# Patient Record
Sex: Male | Born: 1980 | Race: Black or African American | Hispanic: No | Smoking: Current every day smoker
Health system: Southern US, Community
[De-identification: ages and names within clinical notes are randomized; demographics above are authoritative.]

## PROBLEM LIST (undated history)

## (undated) ENCOUNTER — Emergency Department (HOSPITAL_COMMUNITY): Admission: EM | Payer: No Typology Code available for payment source | Source: Home / Self Care

## (undated) DIAGNOSIS — R519 Headache, unspecified: Secondary | ICD-10-CM

## (undated) DIAGNOSIS — M549 Dorsalgia, unspecified: Secondary | ICD-10-CM

## (undated) DIAGNOSIS — R51 Headache: Secondary | ICD-10-CM

---

## 2010-01-23 ENCOUNTER — Emergency Department (HOSPITAL_COMMUNITY): Admission: EM | Admit: 2010-01-23 | Discharge: 2010-01-23 | Payer: Self-pay | Admitting: Emergency Medicine

## 2010-12-27 ENCOUNTER — Emergency Department (HOSPITAL_COMMUNITY): Payer: Self-pay

## 2010-12-27 ENCOUNTER — Observation Stay (HOSPITAL_COMMUNITY)
Admission: EM | Admit: 2010-12-27 | Discharge: 2010-12-28 | Disposition: A | Discharge status: Home / Self Care | Payer: Self-pay | Attending: Emergency Medicine | Admitting: Emergency Medicine

## 2010-12-27 DIAGNOSIS — L02619 Cutaneous abscess of unspecified foot: Principal | ICD-10-CM | POA: Insufficient documentation

## 2010-12-27 DIAGNOSIS — L03119 Cellulitis of unspecified part of limb: Secondary | ICD-10-CM | POA: Insufficient documentation

## 2010-12-27 LAB — CBC
HCT: 43.7 % (ref 39.0–52.0)
MCV: 86.9 fL (ref 78.0–100.0)
Platelets: 327 10*3/uL (ref 150–400)
RBC: 5.03 MIL/uL (ref 4.22–5.81)
WBC: 13.4 10*3/uL — ABNORMAL HIGH (ref 4.0–10.5)

## 2010-12-27 LAB — POCT I-STAT, CHEM 8
Chloride: 105 mEq/L (ref 96–112)
HCT: 46 % (ref 39.0–52.0)
Hemoglobin: 15.6 g/dL (ref 13.0–17.0)
Potassium: 3.6 mEq/L (ref 3.5–5.1)

## 2010-12-27 LAB — DIFFERENTIAL
Basophils Absolute: 0 10*3/uL (ref 0.0–0.1)
Eosinophils Absolute: 0.1 10*3/uL (ref 0.0–0.7)
Lymphocytes Relative: 28 % (ref 12–46)
Lymphs Abs: 3.8 10*3/uL (ref 0.7–4.0)
Neutrophils Relative %: 57 % (ref 43–77)

## 2010-12-29 ENCOUNTER — Inpatient Hospital Stay (INDEPENDENT_AMBULATORY_CARE_PROVIDER_SITE_OTHER)
Admission: RE | Admit: 2010-12-29 | Discharge: 2010-12-29 | Disposition: A | Discharge status: Home / Self Care | Payer: Self-pay | Source: Ambulatory Visit | Attending: Family Medicine | Admitting: Family Medicine

## 2010-12-29 DIAGNOSIS — L03119 Cellulitis of unspecified part of limb: Secondary | ICD-10-CM

## 2012-11-01 ENCOUNTER — Emergency Department (HOSPITAL_COMMUNITY)
Admission: EM | Admit: 2012-11-01 | Discharge: 2012-11-01 | Disposition: A | Discharge status: Home / Self Care | Payer: Self-pay | Attending: Emergency Medicine | Admitting: Emergency Medicine

## 2012-11-01 ENCOUNTER — Encounter (HOSPITAL_COMMUNITY): Payer: Self-pay | Admitting: Cardiology

## 2012-11-01 DIAGNOSIS — IMO0001 Reserved for inherently not codable concepts without codable children: Secondary | ICD-10-CM | POA: Insufficient documentation

## 2012-11-01 DIAGNOSIS — M545 Low back pain, unspecified: Secondary | ICD-10-CM | POA: Insufficient documentation

## 2012-11-01 DIAGNOSIS — F17200 Nicotine dependence, unspecified, uncomplicated: Secondary | ICD-10-CM | POA: Insufficient documentation

## 2012-11-01 DIAGNOSIS — M5412 Radiculopathy, cervical region: Secondary | ICD-10-CM | POA: Insufficient documentation

## 2012-11-01 MED ORDER — CYCLOBENZAPRINE HCL 10 MG PO TABS: 10.0000 mg | ORAL_TABLET | Freq: Two times a day (BID) | ORAL | Status: DC | PRN

## 2012-11-01 MED ORDER — OXYCODONE-ACETAMINOPHEN 5-325 MG PO TABS
1.0000 | ORAL_TABLET | Freq: Once | ORAL | Status: AC
  Administered 2012-11-01: 1 via ORAL
  Filled 2012-11-01: qty 1

## 2012-11-01 MED ORDER — NAPROXEN 500 MG PO TABS: 500.0000 mg | ORAL_TABLET | Freq: Two times a day (BID) | ORAL | Status: DC
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
## 2012-11-02 NOTE — ED Provider Notes (Signed)
Medical screening examination/treatment/procedure(s) were performed by non-physician practitioner and as supervising physician I was immediately available for consultation/collaboration.  Doug Sou, MD 11/02/12 0010

## 2012-12-31 ENCOUNTER — Encounter (HOSPITAL_COMMUNITY): Payer: Self-pay

## 2012-12-31 ENCOUNTER — Emergency Department (HOSPITAL_COMMUNITY)
Admission: EM | Admit: 2012-12-31 | Discharge: 2012-12-31 | Disposition: A | Discharge status: Home / Self Care | Payer: Self-pay | Attending: Emergency Medicine | Admitting: Emergency Medicine

## 2012-12-31 DIAGNOSIS — K0889 Other specified disorders of teeth and supporting structures: Secondary | ICD-10-CM

## 2012-12-31 DIAGNOSIS — K089 Disorder of teeth and supporting structures, unspecified: Secondary | ICD-10-CM | POA: Insufficient documentation

## 2012-12-31 DIAGNOSIS — R6884 Jaw pain: Secondary | ICD-10-CM | POA: Insufficient documentation

## 2012-12-31 DIAGNOSIS — F17200 Nicotine dependence, unspecified, uncomplicated: Secondary | ICD-10-CM | POA: Insufficient documentation

## 2012-12-31 MED ORDER — BUPIVACAINE HCL (PF) 0.5 % IJ SOLN
10.0000 mL | Freq: Once | INTRAMUSCULAR | Status: AC
  Administered 2012-12-31: 10 mL
  Filled 2012-12-31: qty 10

## 2012-12-31 MED ORDER — HYDROCODONE-ACETAMINOPHEN 5-325 MG PO TABS: 2.0000 | ORAL_TABLET | ORAL | Status: DC | PRN

## 2012-12-31 MED ORDER — PENICILLIN V POTASSIUM 500 MG PO TABS: 500.0000 mg | ORAL_TABLET | Freq: Three times a day (TID) | ORAL | Status: DC

## 2012-12-31 NOTE — ED Notes (Signed)
MD at bedside. 

## 2012-12-31 NOTE — ED Provider Notes (Signed)
History     CSN: [id number]  Arrival date & time 12/31/12  4098   First MD Initiated Contact with Patient 12/31/12 [phone]      Chief Complaint  Patient presents with  . Dental Pain    (Consider location/radiation/quality/duration/timing/severity/associated sxs/prior treatment) HPI Comments: The patient is a 32 year old otherwise healthy male who presents with dental pain that started gradually one week ago. The dental pain is severe, constant and progressively worsening. The pain is aching and located in right lower jaw. The pain does not radiate. Eating makes the pain worse. Nothing makes the pain better. The patient has not tried anything for pain. No associated symptoms. Patient denies headache, neck pain/stiffness, fever, NVD, edema, sore throat, throat swelling, wheezing, SOB, chest pain, abdominal pain.     Patient is a 32 y.o. male presenting with tooth pain.  Dental Pain   No past medical history on file.  No past surgical history on file.  No family history on file.  History  Substance Use Topics  . Smoking status: Current Every Day Smoker  . Smokeless tobacco: Not on file  . Alcohol Use: No      Review of Systems  HENT: Positive for dental problem.   All other systems reviewed and are negative.    Allergies  Bee pollen  Home Medications   Current Outpatient Rx  Name  Route  Sig  Dispense  Refill  . ibuprofen (ADVIL,MOTRIN) 200 MG tablet   Oral   Take 800 mg by mouth every 6 (six) hours as needed for pain.           BP 128/74  Pulse 85  Temp(Src) 98.5 F (36.9 C) (Oral)  Resp 22  SpO2 98%  Physical Exam  Nursing note and vitals reviewed. Constitutional: He is oriented to person, place, and time. He appears well-developed and well-nourished. No distress.  HENT:  Head: Normocephalic and atraumatic.  Mouth/Throat: Oropharynx is clear and moist. No oropharyngeal exudate.  Good dentition. Right lower molar tenderness to percussion. No edema  noted.   Eyes: Conjunctivae are normal.  Neck: Normal range of motion.  Cardiovascular: Normal rate and regular rhythm.  Exam reveals no gallop and no friction rub.   No murmur heard. Pulmonary/Chest: Effort normal and breath sounds normal. He has no wheezes. He has no rales. He exhibits no tenderness.  Abdominal: Soft. There is no tenderness.  Musculoskeletal: Normal range of motion.  Neurological: He is alert and oriented to person, place, and time. Coordination normal.  Speech is goal-oriented. Moves limbs without ataxia.   Skin: Skin is warm and dry.  Psychiatric: He has a normal mood and affect. His behavior is normal.    ED Course  NERVE BLOCK Date/Time: 12/31/2012 6:55 AM Performed by: Emilia Beck Authorized by: Emilia Beck Consent: Verbal consent obtained. Risks and benefits: risks, benefits and alternatives were discussed Consent given by: patient Patient understanding: patient states understanding of the procedure being performed Patient consent: the patient's understanding of the procedure matches consent given Procedure consent: procedure consent matches procedure scheduled Relevant documents: relevant documents present and verified Site marked: the operative site was marked Patient identity confirmed: verbally with patient Time out: Immediately prior to procedure a "time out" was called to verify the correct patient, procedure, equipment, support staff and site/side marked as required. Indications: pain relief Body area: face/mouth Nerve: inferior alveolar Laterality: right Patient sedated: no Patient position: sitting Needle gauge: 25 G Location technique: anatomical landmarks Local anesthetic: bupivacaine 0.5%  without epinephrine Anesthetic total: 2 ml Outcome: pain improved Patient tolerance: Patient tolerated the procedure well with no immediate complications.   (including critical care time)  Periodontal Exam  Labs Reviewed - No data to  display No results found.   1. Pain, dental       MDM  6:52 AM Patient reports relief of pain after dental block. I will prescribe the patient vicodin and penicillin for dental pain with instructions to follow up with the dentist from the resource guide. Patient is afebrile with stable vitals.         Emilia Beck, [place] 12/31/12 [phone]

## 2012-12-31 NOTE — ED Notes (Signed)
Patient complains of mouth pain to right lower teeth, no releif at home with ibuprofen, oragel or tramadol, pt reports ongoing for 1 week.

## 2013-01-01 NOTE — ED Provider Notes (Signed)
Medical screening examination/treatment/procedure(s) were performed by non-physician practitioner and as supervising physician I was immediately available for consultation/collaboration.    Vida Roller, MD 01/01/13 [phone]

## 2013-03-11 ENCOUNTER — Encounter (HOSPITAL_COMMUNITY): Payer: Self-pay

## 2013-03-11 ENCOUNTER — Emergency Department (HOSPITAL_COMMUNITY)
Admission: EM | Admit: 2013-03-11 | Discharge: 2013-03-11 | Disposition: A | Discharge status: Home / Self Care | Payer: Self-pay | Attending: Emergency Medicine | Admitting: Emergency Medicine

## 2013-03-11 DIAGNOSIS — K089 Disorder of teeth and supporting structures, unspecified: Secondary | ICD-10-CM | POA: Insufficient documentation

## 2013-03-11 DIAGNOSIS — K0889 Other specified disorders of teeth and supporting structures: Secondary | ICD-10-CM

## 2013-03-11 DIAGNOSIS — F17200 Nicotine dependence, unspecified, uncomplicated: Secondary | ICD-10-CM | POA: Insufficient documentation

## 2013-03-11 MED ORDER — PENICILLIN V POTASSIUM 500 MG PO TABS: 500.0000 mg | ORAL_TABLET | Freq: Four times a day (QID) | ORAL | Status: AC

## 2013-03-11 MED ORDER — OXYCODONE-ACETAMINOPHEN 5-325 MG PO TABS
2.0000 | ORAL_TABLET | Freq: Once | ORAL | Status: AC
  Administered 2013-03-11: 2 via ORAL
  Filled 2013-03-11: qty 2

## 2013-03-11 MED ORDER — OXYCODONE-ACETAMINOPHEN 5-325 MG PO TABS: 1.0000 | ORAL_TABLET | Freq: Three times a day (TID) | ORAL | Status: DC | PRN

## 2013-03-11 NOTE — ED Provider Notes (Signed)
History  This chart was scribed for Glade Nurse, PA-C working with Enid Skeens, MD by Greggory Stallion, ED scribe. This patient was seen in room TR05C/TR05C and the patient's care was started at 10:08 PM.  CSN: [id number] Arrival date & time 03/11/13  2145   Chief Complaint  Patient presents with  . Dental Pain   The history is provided by the patient. No language interpreter was used.    HPI Comments: Stephen Ward is a 32 y.o. male who presents to the Emergency Department complaining of gradual onset, constant lower left dental pain that started 1 week ago. Also complaining of lower right dental pain that has been intermittent since his last visit to the ED in May for which he never followed up. He rates his pain 10/10. Pt states he has a few cavities.  Patient denies fever, night sweats, chills, difficulty swallowing or opening mouth, SOB, nuchal rigidity or decreased ROM of neck.  Patient does not have a dentist and requests a resource guide at discharge.     History reviewed. No pertinent past medical history. History reviewed. No pertinent past surgical history. History reviewed. No pertinent family history. History  Substance Use Topics  . Smoking status: Current Every Day Smoker  . Smokeless tobacco: Not on file  . Alcohol Use: No    Review of Systems  Constitutional: Negative for fever and chills.  HENT: Positive for dental problem. Negative for sore throat, trouble swallowing, neck pain, neck stiffness and voice change.        Bottom left and bottom right  Eyes: Negative for photophobia and visual disturbance.  Respiratory: Negative for shortness of breath.   Cardiovascular: Negative for chest pain.  Gastrointestinal: Negative for nausea and vomiting.  Musculoskeletal: Negative for back pain.  Skin: Negative for rash.  Neurological: Negative for weakness, light-headedness and numbness.  Psychiatric/Behavioral: The patient is not nervous/anxious.     Allergies   Bee pollen  Home Medications   Current Outpatient Rx  Name  Route  Sig  Dispense  Refill  . HYDROcodone-acetaminophen (NORCO/VICODIN) 5-325 MG per tablet   Oral   Take 2 tablets by mouth every 4 (four) hours as needed for pain.   6 tablet   0   . ibuprofen (ADVIL,MOTRIN) 200 MG tablet   Oral   Take 800 mg by mouth every 6 (six) hours as needed for pain.         Marland Kitchen penicillin v potassium (VEETID) 500 MG tablet   Oral   Take 1 tablet (500 mg total) by mouth 3 (three) times daily.   30 tablet   0    BP 114/70  Pulse 77  Temp(Src) 98.6 F (37 C) (Oral)  Resp 20  SpO2 97%  Physical Exam  Nursing note and vitals reviewed. Constitutional: He is oriented to person, place, and time. He appears well-developed and well-nourished.  HENT:  Head: Normocephalic and atraumatic. No trismus in the jaw.  Mouth/Throat: Abnormal dentition. Dental caries present. No dental abscesses. Posterior oropharyngeal edema and posterior oropharyngeal erythema present. No oropharyngeal exudate or tonsillar abscesses.    Oropharynx is clear. Teeth appear carious. Very tender at tooth #17 and #32 with erythema and edema of the underlying gingiva. Poor dentition overall. Neck is nontender and supple without adenopathy or JVD.  Neck: Normal range of motion. Neck supple.  Cardiovascular: Normal rate.   Pulmonary/Chest: Effort normal. No respiratory distress. He has no wheezes.  Abdominal: Soft. There is no tenderness.  Musculoskeletal: Normal range of motion.  Neurological: He is alert and oriented to person, place, and time. No cranial nerve deficit.  Skin: Skin is warm and dry.  Psychiatric: He has a normal mood and affect.    ED Course  Procedures (including critical care time)  DIAGNOSTIC STUDIES: Oxygen Saturation is 97% on RA, normal by my interpretation.    COORDINATION OF CARE: 11:17 PM-Discussed treatment plan which includes antibiotics, ibuprofen, and pain medication with pt at bedside  and pt agreed to plan. Advised pt to follow up with a dentist in the next week.   Labs Reviewed - No data to display No results found. 1. Pain, dental     MDM  Patient with dental pain.  No gross abscess.  Exam unconcerning for Ludwig's angina or spread of infection.  Will treat with penicillin and pain medicine.  Urged patient to follow-up with dentist.  Provided resource material. Discussed options for treatment with pt who understands and is in agreement with discharge plan.   I personally performed the services described in this documentation, which was scribed in my presence. The recorded information has been reviewed and is accurate.    Glade Nurse, PA-C 03/11/13 2334

## 2013-03-11 NOTE — ED Notes (Signed)
Pt c/o pain to his bottom teeth, increase pain on the Left lower due to cavities x1 week

## 2013-03-11 NOTE — Discharge Instructions (Signed)
1. You may take up to 800 mg of ibuprofen (this is typically 4 over the counter pills) up to three times a day for your pain. For breakthrough pain you may take your prescribed Percocet. Do not take other medicines containing acetaminophen as Percocet contains acetaminophen (read the labels!). Do not drink alcohol, drive, or operate heavy machinery while taking Percocet.   2. You have been prescribed an antibiotic. Please take it as directed and until it is all gone.   3. RESOURCE GUIDE  Dental Assistance  Drs. Moreen Fowler Civils:  [address], [place], [place], [address], [phone]  Short-notice availability  Tooth evaluation: $100  Emergency Treatment: $200 (including exam, xrays, tooth extraction, and post-op visit)  Patients with Medicaid: [facility] [phone] [address], [phone] [address], [phone]  If unable to pay, or uninsured, contact [facility] [phone] in [place], [phone] in [facility]) to become qualified for the adult dental clinic  Other Low-Cost Community Dental Services: - [facility]: [address] [place], [place], [place], [address], [phone], Ext. [phone], 2nd and 4th Thursday of the month at 6:30am.  10 clients each day by appointment, can sometimes see walk-in patients if someone does not show for an appointment. [facility]:  [address] [place], [place], [address], [phone] [facility]:  [address], [place], [place], [address], [phone] [facility] Department:  [phone] [facility] Health Department:  [phone] [facility] Health Department:  [phone]

## 2013-03-13 NOTE — ED Provider Notes (Signed)
Medical screening examination/treatment/procedure(s) were performed by non-physician practitioner and as supervising physician I was immediately available for consultation/collaboration.   Enid Skeens, MD 03/13/13 [phone]

## 2013-03-28 ENCOUNTER — Emergency Department (HOSPITAL_COMMUNITY)
Admission: EM | Admit: 2013-03-28 | Discharge: 2013-03-28 | Disposition: A | Discharge status: Home / Self Care | Payer: Self-pay | Attending: Emergency Medicine | Admitting: Emergency Medicine

## 2013-03-28 ENCOUNTER — Encounter (HOSPITAL_COMMUNITY): Payer: Self-pay | Admitting: Emergency Medicine

## 2013-03-28 DIAGNOSIS — K029 Dental caries, unspecified: Secondary | ICD-10-CM | POA: Insufficient documentation

## 2013-03-28 DIAGNOSIS — F17200 Nicotine dependence, unspecified, uncomplicated: Secondary | ICD-10-CM | POA: Insufficient documentation

## 2013-03-28 DIAGNOSIS — K089 Disorder of teeth and supporting structures, unspecified: Secondary | ICD-10-CM | POA: Insufficient documentation

## 2013-03-28 DIAGNOSIS — K0889 Other specified disorders of teeth and supporting structures: Secondary | ICD-10-CM

## 2013-03-28 MED ORDER — LIDOCAINE VISCOUS 2 % MT SOLN
15.0000 mL | Freq: Once | OROMUCOSAL | Status: AC
  Administered 2013-03-28: 15 mL via OROMUCOSAL
  Filled 2013-03-28: qty 15

## 2013-03-28 MED ORDER — LIDOCAINE VISCOUS 2 % MT SOLN: 15.0000 mL | OROMUCOSAL | Status: DC | PRN

## 2013-03-28 MED ORDER — OXYCODONE-ACETAMINOPHEN 5-325 MG PO TABS
1.0000 | ORAL_TABLET | Freq: Once | ORAL | Status: AC
  Administered 2013-03-28: 2 via ORAL
  Filled 2013-03-28: qty 2

## 2013-03-28 MED ORDER — OXYCODONE-ACETAMINOPHEN 5-325 MG PO TABS: 1.0000 | ORAL_TABLET | Freq: Four times a day (QID) | ORAL | Status: DC | PRN

## 2013-03-28 MED ORDER — OXYCODONE-ACETAMINOPHEN 5-325 MG PO TABS: 1.0000 | ORAL_TABLET | Freq: Once | ORAL | Status: DC

## 2013-03-28 NOTE — ED Notes (Signed)
Pt stated that pain has increased on both sides of mouth. Unable to eat due on pain

## 2013-03-28 NOTE — ED Provider Notes (Signed)
CSN: [id number]     Arrival date & time 03/28/13  1740 History    This chart was scribed for Francee Piccolo, PA working with Toy Baker, MD by Quintella Reichert, ED Scribe. This patient was seen in room WTR7/WTR7 and the patient's care was started at 8:23 PM..     Chief Complaint  Patient presents with  . Dental Pain    teeth pain on both sides on mouth    The history is provided by the patient. No language interpreter was used.    HPI Comments: Stephen Ward is a 32 y.o. male who presents to the Emergency Department complaining of gradual-onset, gradually-worsening bilateral dental pain.  Pt has had lower right-sided dental pain intermittently for 3 months and pain to the lower left side that has continued over the last 3 weeks since being seen in the ED at that time. Patient was advised to follow up with a dentist at that time but has not scheduled a followup appointment. Patient rates his pain 10/10. States the pain is sharp and constant without radiation. No alleviating factors. Patient states that eating aggravates his pain. Patient denies any fevers, chills, drooling, difficulty breathing.   History reviewed. No pertinent past medical history.  History reviewed. No pertinent past surgical history.  Family History  Problem Relation Age of Onset  . Seizures Mother    History  Substance Use Topics  . Smoking status: Current Every Day Smoker  . Smokeless tobacco: Not on file  . Alcohol Use: Yes     Review of Systems  Constitutional: Negative for fever and chills.  HENT: Positive for dental problem. Negative for ear pain and drooling.   Neurological: Negative for headaches.  All other systems reviewed and are negative.      Allergies  Bee venom  Home Medications   Current Outpatient Rx  Name  Route  Sig  Dispense  Refill  . ibuprofen (ADVIL,MOTRIN) 200 MG tablet   Oral   Take 200 mg by mouth every 6 (six) hours as needed for pain.         Marland Kitchen  oxyCODONE-acetaminophen (PERCOCET/ROXICET) 5-325 MG per tablet   Oral   Take 1 tablet by mouth every 8 (eight) hours as needed for pain.   6 tablet   0   . lidocaine (XYLOCAINE) 2 % solution   Oral   Take 15 mLs by mouth as needed for pain.   100 mL   0   . oxyCODONE-acetaminophen (PERCOCET/ROXICET) 5-325 MG per tablet   Oral   Take 1-2 tablets by mouth every 6 (six) hours as needed for pain.   20 tablet   0    BP 127/73  Pulse 80  Temp(Src) 98.4 F (36.9 C) (Oral)  Resp 18  SpO2 97%  Physical Exam  Nursing note and vitals reviewed. Constitutional: He is oriented to person, place, and time. He appears well-developed and well-nourished. No distress.  HENT:  Head: Normocephalic and atraumatic. No trismus in the jaw.  Mouth/Throat: Uvula is midline, oropharynx is clear and moist and mucous membranes are normal. Abnormal dentition. Dental caries present. No dental abscesses or edematous.    Eyes: Conjunctivae and EOM are normal.  Neck: Neck supple. No tracheal deviation present.  Cardiovascular: Normal rate.   Pulmonary/Chest: Effort normal. No respiratory distress.  Musculoskeletal: Normal range of motion.  Neurological: He is alert and oriented to person, place, and time.  Skin: Skin is warm and dry. He is not diaphoretic.  Psychiatric: He has a normal mood and affect. His behavior is normal.    ED Course  Procedures (including critical care time)   Labs Reviewed - No data to display  No results found.  1. Pain, dental     MDM  Patient with toothache.  No gross abscess.  Exam unconcerning for Ludwig's angina or spread of infection.  Patient still taking penicillin as prescribed at last visit she finished this course of antibiotic. Pain medication indicated.  Discussed the importance of follow up with pentose for further evaluation and management of dental pain. Patient agreeable to plan. Patient is stable at time of discharge.      I personally performed the  services described in this documentation, which was scribed in my presence. The recorded information has been reviewed and is accurate.     Jeannetta Ellis, PA-C 03/28/13 2023

## 2013-03-29 NOTE — ED Provider Notes (Signed)
Medical screening examination/treatment/procedure(s) were performed by non-physician practitioner and as supervising physician I was immediately available for consultation/collaboration.  Bengie Kaucher T Taquana Bartley, MD 03/29/13 1516 

## 2013-11-17 ENCOUNTER — Encounter (HOSPITAL_COMMUNITY): Payer: Self-pay | Admitting: Emergency Medicine

## 2013-11-17 ENCOUNTER — Emergency Department (INDEPENDENT_AMBULATORY_CARE_PROVIDER_SITE_OTHER)
Admission: EM | Admit: 2013-11-17 | Discharge: 2013-11-17 | Disposition: A | Discharge status: Home / Self Care | Payer: No Typology Code available for payment source | Source: Home / Self Care | Attending: Emergency Medicine | Admitting: Emergency Medicine

## 2013-11-17 DIAGNOSIS — K0401 Reversible pulpitis: Secondary | ICD-10-CM

## 2013-11-17 MED ORDER — HYDROCODONE-ACETAMINOPHEN 5-325 MG PO TABS
ORAL_TABLET | ORAL | Status: AC
  Filled 2013-11-17: qty 2

## 2013-11-17 MED ORDER — OXYCODONE-ACETAMINOPHEN 5-325 MG PO TABS: ORAL_TABLET | ORAL | Status: DC

## 2013-11-17 MED ORDER — HYDROCODONE-ACETAMINOPHEN 5-325 MG PO TABS
2.0000 | ORAL_TABLET | Freq: Once | ORAL | Status: AC
  Administered 2013-11-17: 2 via ORAL

## 2013-11-17 MED ORDER — CLINDAMYCIN HCL 300 MG PO CAPS: 300.0000 mg | ORAL_CAPSULE | Freq: Four times a day (QID) | ORAL | Status: DC

## 2013-11-17 MED ORDER — MELOXICAM 15 MG PO TABS: 15.0000 mg | ORAL_TABLET | Freq: Every day | ORAL | Status: DC

## 2013-11-17 NOTE — Discharge Instructions (Signed)
Look up the [employer] for free dental clinics. Http://www.ncdental.org/ncds/Schedule.asp ° °Get there early and be prepared to wait. [employer] and [employer] have dental hygienist schools that provide low cost routine dental care.  ° °Other resources: °[facility] °[address] °[place], [place] °[phone] ° °Patients with Medicaid: °[facility]                     [facility] °[address]                                [address] °Phone:  [phone]                                                  Phone:  [phone] ° °If unable to pay or uninsured, contact:  [facility] or [facility] to become qualified for the adult dental clinic. ° °No matter what dental problem you have, it will not get better unless you get good dental care.  If the tooth is not taken care of, your symptoms will come back in time and you will be visiting us again in the Urgent Care Center with a bad toothache.  So, see your dentist as soon as possible.  If you don't have a dentist, we can give you a list of dentists.  Sometimes the most cost effective treatment is removal of the tooth.  This can be done very inexpensively through one of the low cost [facility] Centers such as the facility on [address] in [place] ([phone]).  The downside to this is that you will have one less tooth and this can effect your ability to chew. ° °Some other things that can be done for a dental infection include the following: ° °· Rinse your mouth out with hot salt water (1/2 tsp of table salt and a pinch of baking soda in 8 oz of hot water).  You can do this every 2 or 3 hours. °· Avoid cold foods, beverages, and cold air.  This will make your symptoms worse. °· Sleep with your head elevated.  Sleeping flat will cause your gums and oral tissues to swell and make them hurt more.  You can sleep on several pillows.  Even  better is to sleep in a recliner with your head higher than your heart. °· For mild to moderate pain, you can take Tylenol, ibuprofen, or Aleve. °· External application of heat by a heating pad, hot water bottle, or hot wet towel can help with pain and speed healing.  You can do this every 2 to 3 hours. Do not fall asleep on a heating pad since this can cause a burn.  ° °Go to www.goodrx.com to look up your medications. This will give you a list of where you can find your prescriptions at the most affordable prices.  ° °RESOURCE GUIDE ° °Dental Problems ° °Look up http://www.ncdental.org/ncds/Schedule.asp for a schedule of the [employer]'s free dental clinics called [facility]. They have clinics all around [place]. Get there early and be prepared to wait.  ° °[facility] °[address]  [place], [place] [address] °[phone] ° °[facility] °  [address] °[place], [place] °[phone] ° °Patients with Medicaid: °[facility]                      Dental °[address]                                [address] °Phone:  [phone]                                                  Phone:  [phone] ° °If unable to pay or uninsured, contact:  [facility] or [facility] to become qualified for the adult dental clinic. ° °

## 2013-11-17 NOTE — ED Notes (Signed)
Pt  Reports  About  3  Days  Of  A  Severe  l  Lower  Toothache  Which  He  Reports  Has not been           releived  By  [facility] meds

## 2013-11-17 NOTE — ED Provider Notes (Signed)
  Chief Complaint   Chief Complaint  Patient presents with  . Dental Pain    History of Present Illness   Stephen Ward is a-year-old male who two-day history of pain in the left, lower second molar. He denies any injury to the tooth but has had a cavity in that tooth for a while. There is no swelling, no purulent drainage, no difficulty swallowing or breathing, he does have trouble opening his mouth completely and it hurts worse with mouth opening. He's had some headache but no fever or chills. No facial pain or neck pain, no chest pain or shortness of breath.  Review of Systems   Other than as noted above, the patient denies any of the following symptoms: Systemic:  No fever or chills. ENT:  No ear ache, sore throat, nasal congestion, facial pain, or swelling. Lymphatic:  No adenopathy. Lungs:  No coughing or shortness of breath.  PMFSH   Past medical history, family history, social history, meds, and allergies were reviewed.   Physical Examination     Vital signs:  BP 122/54  Pulse 77  Temp(Src) 98.6 F (37 C) (Oral)  Resp 18  SpO2 100% General:  Alert, oriented, in no distress. ENT:  TMs and canals normal.  Nasal mucosa normal. Mouth exam:  His left, lower second molar was almost completely decayed. History tender to touch. There is no swelling of the gingiva no collection of pus. He has no external swelling, he does have some pain with mouth opening is unable his mouth completely. There is no swelling of the tongue or the floor the mouth. The pharynx is clear and airway is widely patent. Neck:  No swelling or adenopathy. Lungs:  Breath sounds clear and equal bilaterally.  No wheezes, rales or rhonchi. Heart:  Regular rhythm.  No gallops or murmers. Skin:  Clear, warm and dry.   Course in Urgent Care Center   Dental gel was applied to the tooth and he was given Norco 5/325 2 by mouth for pain.  Assessment   The encounter diagnosis was Pulpitis.  Plan   1.  Meds:   The following meds were prescribed:   Discharge Medication List as of 11/17/2013  1:30 PM    START taking these medications   Details  clindamycin (CLEOCIN) 300 MG capsule Take 1 capsule (300 mg total) by mouth 4 (four) times daily., Starting 11/17/2013, Until Discontinued, Normal    meloxicam (MOBIC) 15 MG tablet Take 1 tablet (15 mg total) by mouth daily., Starting 11/17/2013, Until Discontinued, Normal    !! oxyCODONE-acetaminophen (PERCOCET) 5-325 MG per tablet 1 to 2 tablets every 6 hours as needed for pain., Print     !! - Potential duplicate medications found. Please discuss with provider.      2.  Patient Education/Counseling:  The patient was given appropriate handouts, self care instructions, and instructed in symptomatic relief. Suggested sleeping with head of bed elevated and hot salt water mouthwash.   3.  Follow up:  The patient was told to follow up if no better in 3 to 4 days, if becoming worse in any way, and given some red flag symptoms such as difficulty swallowing or breathing which would prompt immediate return.  Follow up with a dentist as soon as posssible.     Reuben Likesavid C Zacharia Sowles, MD 11/17/13 [phone]

## 2015-01-05 ENCOUNTER — Emergency Department (HOSPITAL_COMMUNITY)
Admission: EM | Admit: 2015-01-05 | Discharge: 2015-01-05 | Disposition: A | Discharge status: Home / Self Care | Payer: No Typology Code available for payment source | Attending: Emergency Medicine | Admitting: Emergency Medicine

## 2015-01-05 ENCOUNTER — Encounter (HOSPITAL_COMMUNITY): Payer: Self-pay | Admitting: Emergency Medicine

## 2015-01-05 DIAGNOSIS — Y929 Unspecified place or not applicable: Secondary | ICD-10-CM | POA: Insufficient documentation

## 2015-01-05 DIAGNOSIS — S39012A Strain of muscle, fascia and tendon of lower back, initial encounter: Secondary | ICD-10-CM | POA: Insufficient documentation

## 2015-01-05 DIAGNOSIS — X58XXXA Exposure to other specified factors, initial encounter: Secondary | ICD-10-CM | POA: Insufficient documentation

## 2015-01-05 DIAGNOSIS — Z792 Long term (current) use of antibiotics: Secondary | ICD-10-CM | POA: Insufficient documentation

## 2015-01-05 DIAGNOSIS — Z720 Tobacco use: Secondary | ICD-10-CM | POA: Insufficient documentation

## 2015-01-05 DIAGNOSIS — Y999 Unspecified external cause status: Secondary | ICD-10-CM | POA: Insufficient documentation

## 2015-01-05 DIAGNOSIS — Z79899 Other long term (current) drug therapy: Secondary | ICD-10-CM | POA: Insufficient documentation

## 2015-01-05 DIAGNOSIS — Y939 Activity, unspecified: Secondary | ICD-10-CM | POA: Insufficient documentation

## 2015-01-05 MED ORDER — KETOROLAC TROMETHAMINE 60 MG/2ML IM SOLN
60.0000 mg | Freq: Once | INTRAMUSCULAR | Status: AC
  Administered 2015-01-05: 60 mg via INTRAMUSCULAR
  Filled 2015-01-05: qty 2

## 2015-01-05 MED ORDER — CYCLOBENZAPRINE HCL 10 MG PO TABS: 10.0000 mg | ORAL_TABLET | Freq: Two times a day (BID) | ORAL | Status: DC | PRN

## 2015-01-05 MED ORDER — TRAMADOL HCL 50 MG PO TABS: 50.0000 mg | ORAL_TABLET | Freq: Four times a day (QID) | ORAL | Status: DC | PRN

## 2015-01-05 MED ORDER — NAPROXEN 500 MG PO TABS: 500.0000 mg | ORAL_TABLET | Freq: Two times a day (BID) | ORAL | Status: DC

## 2015-01-05 NOTE — ED Provider Notes (Signed)
CSN: [id number]     Arrival date & time 01/05/15  [id number] History   First MD Initiated Contact with Patient 01/05/15 [phone]     Chief Complaint  Patient presents with  . Back Pain   Patient is a 34 y.o. male presenting with back pain. The history is provided by the patient.  Back Pain Location:  Lumbar spine Quality:  Aching Pain severity:  Moderate Onset quality:  Gradual Duration:  1 week Timing:  Constant Progression:  Worsening Chronicity:  New Context: not falling, not MVA and not recent injury   Relieved by:  Nothing Worsened by:  Bending Associated symptoms: no abdominal pain, no dysuria, no fever, no leg pain, no numbness, no paresthesias, no tingling and no weakness     History reviewed. No pertinent past medical history. History reviewed. No pertinent past surgical history. Family History  Problem Relation Age of Onset  . Seizures Mother    History  Substance Use Topics  . Smoking status: Current Every Day Smoker  . Smokeless tobacco: Not on file  . Alcohol Use: Yes    Review of Systems  Constitutional: Negative for fever.  Gastrointestinal: Negative for abdominal pain.  Genitourinary: Negative for dysuria.  Musculoskeletal: Positive for back pain.  Neurological: Negative for tingling, weakness, numbness and paresthesias.  All other systems reviewed and are negative.     Allergies  Bee venom  Home Medications   Prior to Admission medications   Medication Sig Start Date End Date Taking? Authorizing Provider  clindamycin (CLEOCIN) 300 MG capsule Take 1 capsule (300 mg total) by mouth 4 (four) times daily. 11/17/13   Reuben Likesavid C Keller, MD  cyclobenzaprine (FLEXERIL) 10 MG tablet Take 1 tablet (10 mg total) by mouth 2 (two) times daily as needed for muscle spasms. 01/05/15   Linwood DibblesJon Leiana Rund, MD  lidocaine (XYLOCAINE) 2 % solution Take 15 mLs by mouth as needed for pain. 03/28/13   Jennifer Piepenbrink, PA-C  naproxen (NAPROSYN) 500 MG tablet Take 1 tablet (500 mg total) by  mouth 2 (two) times daily. 01/05/15   Linwood DibblesJon Tayvon Culley, MD  traMADol (ULTRAM) 50 MG tablet Take 1 tablet (50 mg total) by mouth every 6 (six) hours as needed. 01/05/15   Linwood DibblesJon Hubbard Seldon, MD   BP 123/77 mmHg  Pulse 85  Temp(Src) 97.4 F (36.3 C) (Oral)  Resp 16  Ht 5\' 7"  (1.702 m)  Wt 210 lb (95.255 kg)  BMI 32.88 kg/m2  SpO2 99% Physical Exam  Constitutional: He appears well-developed and well-nourished.  HENT:  Head: Normocephalic and atraumatic.  Right Ear: External ear normal.  Left Ear: External ear normal.  Nose: Nose normal.  Eyes: Conjunctivae and EOM are normal.  Neck: Neck supple. No tracheal deviation present.  Cardiovascular: Normal rate and regular rhythm.  Exam reveals no friction rub.   No murmur heard. Pulmonary/Chest: Effort normal. No stridor. No respiratory distress.  Abdominal: Soft. He exhibits no distension. There is no tenderness. There is no rebound.  Musculoskeletal: He exhibits no edema or tenderness.       Lumbar back: He exhibits decreased range of motion, pain and spasm. He exhibits no swelling and no edema.  Neurological: He is alert. He is not disoriented. No cranial nerve deficit or sensory deficit. He exhibits normal muscle tone. Coordination normal.  Reflex Scores:      Patellar reflexes are 2+ on the right side and 2+ on the left side.      Achilles reflexes are 2+ on the right side  and 2+ on the left side. Skin: Skin is warm and dry. No rash noted. He is not diaphoretic. No erythema.  Psychiatric: He has a normal mood and affect. His behavior is normal. Thought content normal.  Nursing note and vitals reviewed.   ED Course  Procedures (including critical care time)   MDM   Final diagnoses:  Lumbar strain, initial encounter    No sign of acute neurological or vascular emergency associated with pt's back pain. Doubt radicular etiology, sciatica.  Pain medications , muscle relaxant.  Follow-up with her primary care doctor urgent care the symptoms  persist, discussed home exercises and supportive treatment.   Safe for outpatient follow up.     Linwood DibblesJon Evangelyn Crouse, MD 01/05/15 [phone]

## 2015-01-05 NOTE — ED Notes (Signed)
Patient coming from home with c/o of lower back pain that has been ongoing x 1 week.  Denies injury.

## 2015-01-05 NOTE — Discharge Instructions (Signed)
Back Pain, Adult Low back pain is very common. About 1 in 5 people have back pain.The cause of low back pain is rarely dangerous. The pain often gets better over time.About half of people with a sudden onset of back pain feel better in just 2 weeks. About 8 in 10 people feel better by 6 weeks.  CAUSES Some common causes of back pain include:  Strain of the muscles or ligaments supporting the spine.  Wear and tear (degeneration) of the spinal discs.  Arthritis.  Direct injury to the back. DIAGNOSIS Most of the time, the direct cause of low back pain is not known.However, back pain can be treated effectively even when the exact cause of the pain is unknown.Answering your caregiver's questions about your overall health and symptoms is one of the most accurate ways to make sure the cause of your pain is not dangerous. If your caregiver needs more information, he or she may order lab work or imaging tests (X-rays or MRIs).However, even if imaging tests show changes in your back, this usually does not require surgery. HOME CARE INSTRUCTIONS For many people, back pain returns.Since low back pain is rarely dangerous, it is often a condition that people can learn to manageon their own.   Remain active. It is stressful on the back to sit or stand in one place. Do not sit, drive, or stand in one place for more than 30 minutes at a time. Take short walks on level surfaces as soon as pain allows.Try to increase the length of time you walk each day.  Do not stay in bed.Resting more than 1 or 2 days can delay your recovery.  Do not avoid exercise or work.Your body is made to move.It is not dangerous to be active, even though your back may hurt.Your back will likely heal faster if you return to being active before your pain is gone.  Pay attention to your body when you bend and lift. Many people have less discomfortwhen lifting if they bend their knees, keep the load close to their bodies,and  avoid twisting. Often, the most comfortable positions are those that put less stress on your recovering back.  Find a comfortable position to sleep. Use a firm mattress and lie on your side with your knees slightly bent. If you lie on your back, put a pillow under your knees.  Only take over-the-counter or prescription medicines as directed by your caregiver. Over-the-counter medicines to reduce pain and inflammation are often the most helpful.Your caregiver may prescribe muscle relaxant drugs.These medicines help dull your pain so you can more quickly return to your normal activities and healthy exercise.  Put ice on the injured area.  Put ice in a plastic bag.  Place a towel between your skin and the bag.  Leave the ice on for 15-20 minutes, 03-04 times a day for the first 2 to 3 days. After that, ice and heat may be alternated to reduce pain and spasms.  Ask your caregiver about trying back exercises and gentle massage. This may be of some benefit.  Avoid feeling anxious or stressed.Stress increases muscle tension and can worsen back pain.It is important to recognize when you are anxious or stressed and learn ways to manage it.Exercise is a great option. SEEK MEDICAL CARE IF:  You have pain that is not relieved with rest or medicine.  You have pain that does not improve in 1 week.  You have new symptoms.  You are generally not feeling well. SEEK   IMMEDIATE MEDICAL CARE IF:   You have pain that radiates from your back into your legs.  You develop new bowel or bladder control problems.  You have unusual weakness or numbness in your arms or legs.  You develop nausea or vomiting.  You develop abdominal pain.  You feel faint. Document Released: 08/07/2005 Document Revised: 02/06/2012 Document Reviewed: 12/09/2013 [employer] Patient Information 2015 [employer]. This information is not intended to replace advice given to you by your health care provider. Make sure you  discuss any questions you have with your health care provider.  

## 2015-02-11 ENCOUNTER — Emergency Department (HOSPITAL_COMMUNITY)
Admission: EM | Admit: 2015-02-11 | Discharge: 2015-02-11 | Disposition: A | Discharge status: Home / Self Care | Payer: No Typology Code available for payment source | Attending: Emergency Medicine | Admitting: Emergency Medicine

## 2015-02-11 ENCOUNTER — Encounter (HOSPITAL_COMMUNITY): Payer: Self-pay | Admitting: *Deleted

## 2015-02-11 DIAGNOSIS — M545 Low back pain, unspecified: Secondary | ICD-10-CM

## 2015-02-11 DIAGNOSIS — Z720 Tobacco use: Secondary | ICD-10-CM | POA: Insufficient documentation

## 2015-02-11 MED ORDER — KETOROLAC TROMETHAMINE 60 MG/2ML IM SOLN
60.0000 mg | Freq: Once | INTRAMUSCULAR | Status: AC
  Administered 2015-02-11: 60 mg via INTRAMUSCULAR
  Filled 2015-02-11: qty 2

## 2015-02-11 MED ORDER — HYDROCODONE-ACETAMINOPHEN 5-325 MG PO TABS: 2.0000 | ORAL_TABLET | ORAL | Status: DC | PRN

## 2015-02-11 MED ORDER — METHOCARBAMOL 500 MG PO TABS: 500.0000 mg | ORAL_TABLET | Freq: Two times a day (BID) | ORAL | Status: DC

## 2015-02-11 MED ORDER — NAPROXEN 500 MG PO TABS: 500.0000 mg | ORAL_TABLET | Freq: Two times a day (BID) | ORAL | Status: DC

## 2015-02-11 MED ORDER — NAPROXEN 500 MG PO TABS
500.0000 mg | ORAL_TABLET | Freq: Two times a day (BID) | ORAL | Status: DC
Start: 2015-02-11 — End: 2015-02-11

## 2015-02-11 MED ORDER — DIAZEPAM 5 MG PO TABS
5.0000 mg | ORAL_TABLET | Freq: Once | ORAL | Status: AC
  Administered 2015-02-11: 5 mg via ORAL
  Filled 2015-02-11: qty 1

## 2015-02-11 NOTE — ED Notes (Signed)
NAD at this time. Pt is stable and going home.  

## 2015-02-11 NOTE — ED Provider Notes (Signed)
CSN: [id number]     Arrival date & time 02/11/15  [id number] History   First MD Initiated Contact with Patient 02/11/15 0920     Chief Complaint  Patient presents with  . Tailbone Pain     (Consider location/radiation/quality/duration/timing/severity/associated sxs/prior Treatment) HPI Stephen Ward is a 34 y.o. male who comes in for evaluation of low back pain. Patient states she was seen in the ED approximately one month ago for the same problem. He reports injuring his lower back at work picking up a box of foam and turning funny. He reports sharp pains that shoot from his tailbone of his back. This discomfort is intermittent and exacerbated with certain movements. He denies any fevers, chills, numbness or weakness, loss of bowel or bladder function, chronic steroid use, IV drug use. He reports receiving a muscle relaxer and another pain medicine that helped for a few days, but the discomfort has returned. Pain is rated as severe. No other modifying factors  History reviewed. No pertinent past medical history. History reviewed. No pertinent past surgical history. Family History  Problem Relation Age of Onset  . Seizures Mother    History  Substance Use Topics  . Smoking status: Current Every Day Smoker  . Smokeless tobacco: Not on file  . Alcohol Use: Yes    Review of Systems A 10 point review of systems was completed and was negative except for pertinent positives and negatives as mentioned in the history of present illness     Allergies  Bee venom  Home Medications   Prior to Admission medications   Medication Sig Start Date End Date Taking? Authorizing Provider  traMADol (ULTRAM) 50 MG tablet Take 1 tablet (50 mg total) by mouth every 6 (six) hours as needed. 01/05/15  Yes Linwood Dibbles, MD  HYDROcodone-acetaminophen (NORCO) 5-325 MG per tablet Take 2 tablets by mouth every 4 (four) hours as needed. 02/11/15   Joycie Peek, PA-C  methocarbamol (ROBAXIN) 500 MG tablet Take 1  tablet (500 mg total) by mouth 2 (two) times daily. 02/11/15   Joycie Peek, PA-C  naproxen (NAPROSYN) 500 MG tablet Take 1 tablet (500 mg total) by mouth 2 (two) times daily. 02/11/15   Caedan Sumler, PA-C   BP 107/70 mmHg  Pulse 72  Temp(Src) 98.6 F (37 C)  Resp 16  Ht  (1.702 m)  Wt 210 lb (95.255 kg)  BMI 32.88 kg/m2  SpO2 96% Physical Exam  Constitutional: He is oriented to person, place, and time. He appears well-developed and well-nourished.  HENT:  Head: Normocephalic and atraumatic.  Mouth/Throat: Oropharynx is clear and moist.  Eyes: Conjunctivae are normal. Pupils are equal, round, and reactive to light. Right eye exhibits no discharge. Left eye exhibits no discharge. No scleral icterus.  Neck: Neck supple.  Cardiovascular: Normal rate, regular rhythm and normal heart sounds.   Pulmonary/Chest: Effort normal and breath sounds normal. No respiratory distress. He has no wheezes. He has no rales.  Abdominal: Soft. There is no tenderness.  Musculoskeletal: He exhibits no tenderness.  Tenderness diffusely throughout right paraspinal lumbar muscles. No overt midline bony tenderness. No obvious lesions or rashes noted. Range of motion is decreased secondary to pain. Maintains full range motion of lower extremities.  Neurological: He is alert and oriented to person, place, and time.  Cranial Nerves II-XII grossly intact. Moves all extremities without ataxia. Patellar reflexes 2+ and equal bilaterally.  Skin: Skin is warm and dry. No rash noted.  Psychiatric: He has a normal  mood and affect.  Nursing note and vitals reviewed.   ED Course  Procedures (including critical care time) Labs Review Labs Reviewed - No data to display  Imaging Review No results found.   EKG Interpretation None     Meds given in ED:  Medications  ketorolac (TORADOL) injection 60 mg (60 mg Intramuscular Given 02/11/15 0955)  diazepam (VALIUM) tablet 5 mg (5 mg Oral Given 02/11/15 0956)     New Prescriptions   HYDROCODONE-ACETAMINOPHEN (NORCO) 5-325 MG PER TABLET    Take 2 tablets by mouth every 4 (four) hours as needed.   METHOCARBAMOL (ROBAXIN) 500 MG TABLET    Take 1 tablet (500 mg total) by mouth 2 (two) times daily.   NAPROXEN (NAPROSYN) 500 MG TABLET    Take 1 tablet (500 mg total) by mouth 2 (two) times daily.   Filed Vitals:   02/11/15 0935  BP: 107/70  Pulse: 72  Temp: 98.6 F (37 C)  Resp: 16  Height: 5\' 7"  (1.702 m)  Weight: 210 lb (95.255 kg)  SpO2: 96%     MDM  Vitals stable - WNL -afebrile Pt resting comfortably in ED. Sleeping upon reevaluation, however when he wakes up reports he still in pain. PE--normal neuro exam. Physical exam is consistent with lumbosacral strain.  DDX--encourage continued use of anti-inflammatory to home. Will DC with short course muscle relaxers and pain medicines. Also encouraged use of heat therapy and stretching to help with symptoms. No pathologic back pain red flags. No evidence of acute spinal cord pathology.  I discussed all relevant lab findings and imaging results with pt and they verbalized understanding. Discussed f/u with PCP within 48 hrs and return precautions, pt very amenable to plan.  Final diagnoses:  Right-sided low back pain without sciatica       Joycie Peek, PA-C 02/11/15 1028  Mancel Bale, MD 02/15/15 [phone]

## 2015-02-11 NOTE — ED Notes (Signed)
Pt stated that about a month ago, his tailbone started hurting very badly.  He stated that it is a sharp shooting pain that radiates from his tailbone to his neck.  He came to the ER to have it looked at, at that time.  He was given muscle relaxers which hasn't helped any.

## 2015-02-11 NOTE — Discharge Instructions (Signed)
You were evaluated in the ED today for your back pain. There does not appear to be an emergent cause her symptoms at this time. Please take your medications as prescribed. Do not take your pain medicines or muscle relaxers before driving or operating machinery. Follow up with primary care for further evaluation and management of your symptoms. Return to ED for new or worsening symptoms.  Back Pain, Adult Low back pain is very common. About 1 in 5 people have back pain.The cause of low back pain is rarely dangerous. The pain often gets better over time.About half of people with a sudden onset of back pain feel better in just 2 weeks. About 8 in 10 people feel better by 6 weeks.  CAUSES Some common causes of back pain include:  Strain of the muscles or ligaments supporting the spine.  Wear and tear (degeneration) of the spinal discs.  Arthritis.  Direct injury to the back. DIAGNOSIS Most of the time, the direct cause of low back pain is not known.However, back pain can be treated effectively even when the exact cause of the pain is unknown.Answering your caregiver's questions about your overall health and symptoms is one of the most accurate ways to make sure the cause of your pain is not dangerous. If your caregiver needs more information, he or she may order lab work or imaging tests (X-rays or MRIs).However, even if imaging tests show changes in your back, this usually does not require surgery. HOME CARE INSTRUCTIONS For many people, back pain returns.Since low back pain is rarely dangerous, it is often a condition that people can learn to [facility] their own.   Remain active. It is stressful on the back to sit or stand in one place. Do not sit, drive, or stand in one place for more than 30 minutes at a time. Take short walks on level surfaces as soon as pain allows.Try to increase the length of time you walk each day.  Do not stay in bed.Resting more than 1 or 2 days can delay your  recovery.  Do not avoid exercise or work.Your body is made to move.It is not dangerous to be active, even though your back may hurt.Your back will likely heal faster if you return to being active before your pain is gone.  Pay attention to your body when you bend and lift. Many people have less discomfortwhen lifting if they bend their knees, keep the load close to their bodies,and avoid twisting. Often, the most comfortable positions are those that put less stress on your recovering back.  Find a comfortable position to sleep. Use a firm mattress and lie on your side with your knees slightly bent. If you lie on your back, put a pillow under your knees.  Only take over-the-counter or prescription medicines as directed by your caregiver. Over-the-counter medicines to reduce pain and inflammation are often the most helpful.Your caregiver may prescribe muscle relaxant drugs.These medicines help dull your pain so you can more quickly return to your normal activities and healthy exercise.  Put ice on the injured area.  Put ice in a plastic bag.  Place a towel between your skin and the bag.  Leave the ice on for 15-20 minutes, 03-04 times a day for the first 2 to 3 days. After that, ice and heat may be alternated to reduce pain and spasms.  Ask your caregiver about trying back exercises and gentle massage. This may be of some benefit.  Avoid feeling anxious or stressed.Stress increases  muscle tension and can worsen back pain.It is important to recognize when you are anxious or stressed and learn ways to manage it.Exercise is a great option. SEEK MEDICAL CARE IF:  You have pain that is not relieved with rest or medicine.  You have pain that does not improve in 1 week.  You have new symptoms.  You are generally not feeling well. SEEK IMMEDIATE MEDICAL CARE IF:   You have pain that radiates from your back into your legs.  You develop new bowel or bladder control problems.  You  have unusual weakness or numbness in your arms or legs.  You develop nausea or vomiting.  You develop abdominal pain.  You feel faint. Document Released: 08/07/2005 Document Revised: 02/06/2012 Document Reviewed: 12/09/2013 [facility] Patient Information 2015 [place], [place]. This information is not intended to replace advice given to you by your health care provider. Make sure you discuss any questions you have with your health care provider.

## 2015-07-23 ENCOUNTER — Emergency Department (HOSPITAL_COMMUNITY)
Admission: EM | Admit: 2015-07-23 | Discharge: 2015-07-23 | Disposition: A | Discharge status: Home / Self Care | Payer: No Typology Code available for payment source | Attending: Emergency Medicine | Admitting: Emergency Medicine

## 2015-07-23 ENCOUNTER — Encounter (HOSPITAL_COMMUNITY): Payer: Self-pay | Admitting: *Deleted

## 2015-07-23 ENCOUNTER — Emergency Department (HOSPITAL_COMMUNITY): Payer: No Typology Code available for payment source

## 2015-07-23 DIAGNOSIS — Z79899 Other long term (current) drug therapy: Secondary | ICD-10-CM | POA: Insufficient documentation

## 2015-07-23 DIAGNOSIS — W260XXA Contact with knife, initial encounter: Secondary | ICD-10-CM | POA: Insufficient documentation

## 2015-07-23 DIAGNOSIS — S61211A Laceration without foreign body of left index finger without damage to nail, initial encounter: Secondary | ICD-10-CM | POA: Insufficient documentation

## 2015-07-23 DIAGNOSIS — Y9289 Other specified places as the place of occurrence of the external cause: Secondary | ICD-10-CM | POA: Insufficient documentation

## 2015-07-23 DIAGNOSIS — Z23 Encounter for immunization: Secondary | ICD-10-CM | POA: Insufficient documentation

## 2015-07-23 DIAGNOSIS — F17200 Nicotine dependence, unspecified, uncomplicated: Secondary | ICD-10-CM | POA: Insufficient documentation

## 2015-07-23 DIAGNOSIS — Y999 Unspecified external cause status: Secondary | ICD-10-CM | POA: Insufficient documentation

## 2015-07-23 DIAGNOSIS — Z791 Long term (current) use of non-steroidal anti-inflammatories (NSAID): Secondary | ICD-10-CM | POA: Insufficient documentation

## 2015-07-23 DIAGNOSIS — Y9389 Activity, other specified: Secondary | ICD-10-CM | POA: Insufficient documentation

## 2015-07-23 MED ORDER — HYDROCODONE-ACETAMINOPHEN 5-325 MG PO TABS
2.0000 | ORAL_TABLET | Freq: Once | ORAL | Status: AC
  Administered 2015-07-23: 2 via ORAL
  Filled 2015-07-23: qty 2

## 2015-07-23 MED ORDER — TETANUS-DIPHTH-ACELL PERTUSSIS 5-2.5-18.5 LF-MCG/0.5 IM SUSP
0.5000 mL | Freq: Once | INTRAMUSCULAR | Status: AC
  Administered 2015-07-23: 0.5 mL via INTRAMUSCULAR
  Filled 2015-07-23: qty 0.5

## 2015-07-23 MED ORDER — LIDOCAINE HCL (PF) 2 % IJ SOLN
10.0000 mL | Freq: Once | INTRAMUSCULAR | Status: AC
  Administered 2015-07-23: 10 mL
  Filled 2015-07-23: qty 10

## 2015-07-23 MED ORDER — HYDROCODONE-ACETAMINOPHEN 5-325 MG PO TABS: 1.0000 | ORAL_TABLET | Freq: Four times a day (QID) | ORAL | Status: DC | PRN

## 2015-07-23 NOTE — ED Provider Notes (Addendum)
CSN: [id number]     Arrival date & time 07/23/15  0818 History   First MD Initiated Contact with Patient 07/23/15 [phone]     Chief Complaint  Patient presents with  . Extremity Laceration     (Consider location/radiation/quality/duration/timing/severity/associated sxs/prior Treatment) HPI  34 year old male presents about one hour after cutting his left index finger. Patient was trying to open a package with a knife and states he took his eye out for knife and sliced his own finger. Rates his pain as severe. Bleeding is controlled. Last tetanus immunization was at least 5 years ago. No other injuries.  History reviewed. No pertinent past medical history. No past surgical history on file. Family History  Problem Relation Age of Onset  . Seizures Mother    Social History  Substance Use Topics  . Smoking status: Current Every Day Smoker  . Smokeless tobacco: None  . Alcohol Use: Yes    Review of Systems  Skin: Positive for wound.  All other systems reviewed and are negative.     Allergies  Bee venom  Home Medications   Prior to Admission medications   Medication Sig Start Date End Date Taking? Authorizing Provider  HYDROcodone-acetaminophen (NORCO) 5-325 MG per tablet Take 2 tablets by mouth every 4 (four) hours as needed. 02/11/15   Joycie Peek, PA-C  methocarbamol (ROBAXIN) 500 MG tablet Take 1 tablet (500 mg total) by mouth 2 (two) times daily. 02/11/15   Joycie Peek, PA-C  naproxen (NAPROSYN) 500 MG tablet Take 1 tablet (500 mg total) by mouth 2 (two) times daily. 02/11/15   Joycie Peek, PA-C  traMADol (ULTRAM) 50 MG tablet Take 1 tablet (50 mg total) by mouth every 6 (six) hours as needed. 01/05/15   Linwood Dibbles, MD   BP 126/73 mmHg  Pulse 75  Temp(Src) 97.8 F (36.6 C) (Oral)  Resp 19  Ht  (1.702 m)  Wt 215 lb (97.523 kg)  BMI 33.67 kg/m2  SpO2 95% Physical Exam  Constitutional: He is oriented to person, place, and time. He appears well-developed  and well-nourished.  HENT:  Head: Normocephalic and atraumatic.  Right Ear: External ear normal.  Left Ear: External ear normal.  Nose: Nose normal.  Eyes: Right eye exhibits no discharge. Left eye exhibits no discharge.  Neck: Neck supple.  Cardiovascular: Intact distal pulses.   Pulmonary/Chest: Effort normal.  Abdominal: He exhibits no distension.  Musculoskeletal: He exhibits no edema.       Left hand: He exhibits tenderness and laceration.       Hands: Neurological: He is alert and oriented to person, place, and time.  Skin: Skin is warm and dry.  Nursing note and vitals reviewed.   ED Course  .Marland KitchenLaceration Repair Date/Time: 07/23/2015 10:14 AM Performed by: Pricilla Loveless Authorized by: Pricilla Loveless Consent: Verbal consent obtained. Risks and benefits: risks, benefits and alternatives were discussed Consent given by: patient Body area: upper extremity Location details: left index finger Laceration length: 2 cm Foreign bodies: no foreign bodies Tendon involvement: none Nerve involvement: none Vascular damage: no Anesthesia: digital block Preparation: Patient was prepped and draped in the usual sterile fashion. Irrigation solution: saline Irrigation method: syringe Amount of cleaning: standard Degree of undermining: none Skin closure: 5-0 nylon Number of sutures: 3 Technique: simple Approximation: close Approximation difficulty: simple Dressing: 4x4 sterile gauze and antibiotic ointment Patient tolerance: Patient tolerated the procedure well with no immediate complications  .Nerve Block Date/Time: 07/23/2015 10:15 AM Performed by: Pricilla Loveless Authorized by: Criss Alvine  Naela Nodal Consent: Verbal consent obtained. Risks and benefits: risks, benefits and alternatives were discussed Consent given by: patient Indications: pain relief and wound distortion Body area: upper extremity Nerve: digital Laterality: left Patient sedated: no Preparation: Patient was  prepped and draped in the usual sterile fashion. Patient position: sitting Needle gauge: 25 G Location technique: anatomical landmarks Local anesthetic: lidocaine 2% without epinephrine Outcome: pain improved Patient tolerance: Patient tolerated the procedure well with no immediate complications   (including critical care time) Labs Review Labs Reviewed - No data to display  Imaging Review Dg Finger Index Left  07/23/2015  CLINICAL DATA:  Laceration with a knife at distal finger EXAM: LEFT INDEX FINGER 2+V COMPARISON:  None FINDINGS: Osseous mineralization normal. Joint spaces preserved. No fracture, dislocation, or bone destruction. No radiopaque foreign body. IMPRESSION: Normal exam. Electronically Signed   By: Ulyses SouthwardMark  Boles M.D.   On: 07/23/2015 09:32   I have personally reviewed and evaluated these images and lab results as part of my medical decision-making.   EKG Interpretation None      MDM   Final diagnoses:  Laceration of left index finger w/o foreign body w/o damage to nail, initial encounter    34 year old male with an uncomplicated left finger laceration. No evidence of bony involvement. Limited range of motion due to pain but no acute neurovascular dysfunction. Tetanus will be updated, wound repaired as above. Follow-up with hand specialist in 7 days for suture removal. Advised of wound care precautions and return precautions.    Pricilla LovelessScott Dejanae Helser, MD 07/23/15 1016  Pricilla LovelessScott Jossalin Chervenak, MD 07/23/15 1018

## 2015-07-23 NOTE — ED Notes (Signed)
Pt with lac to L index finger after cutting it with steak knife.  Bleeding is controlled.

## 2015-07-23 NOTE — ED Notes (Signed)
Pt at X-ray

## 2015-07-23 NOTE — ED Notes (Addendum)
Pt is in stable condition upon dc/ and ambulates from ED. Pt lac was dressed with bacitracin and a 2x2 before d/c.

## 2015-08-10 ENCOUNTER — Encounter (HOSPITAL_COMMUNITY): Payer: Self-pay

## 2015-08-10 ENCOUNTER — Emergency Department (HOSPITAL_COMMUNITY)
Admission: EM | Admit: 2015-08-10 | Discharge: 2015-08-10 | Disposition: A | Discharge status: Home / Self Care | Payer: No Typology Code available for payment source | Attending: Emergency Medicine | Admitting: Emergency Medicine

## 2015-08-10 DIAGNOSIS — Z4802 Encounter for removal of sutures: Secondary | ICD-10-CM

## 2015-08-10 DIAGNOSIS — F17200 Nicotine dependence, unspecified, uncomplicated: Secondary | ICD-10-CM | POA: Insufficient documentation

## 2015-08-10 MED ORDER — CEPHALEXIN 500 MG PO CAPS: 500.0000 mg | ORAL_CAPSULE | Freq: Three times a day (TID) | ORAL | Status: DC

## 2015-08-10 NOTE — ED Provider Notes (Signed)
CSN: [id number]     Arrival date & time 08/10/15  1145 History  By signing my name below, I, Essence Howell, attest that this documentation has been prepared under the direction and in the presence of Noelle Penner, PA-C Electronically Signed: Charline Bills, ED Scribe 08/10/2015 at 12:06 PM.   Chief Complaint  Patient presents with  . Suture / Staple Removal   The history is provided by the patient. No language interpreter was used.   HPI Comments: Stephen Ward is a 34 y.o. male who presents to the Emergency Department for suture removal. Pt had 3 sutures placed in his left index finger on 07/23/15 following a laceration sustained by a knife while opening a package. He reports constant pain to the left index finger over the past few days that is exacerbated with palpation and bending. Pt denies drainage from the area. Denies numbness, tingling, erythema.   No past medical history on file. No past surgical history on file. Family History  Problem Relation Age of Onset  . Seizures Mother    Social History  Substance Use Topics  . Smoking status: Current Every Day Smoker  . Smokeless tobacco: Not on file  . Alcohol Use: Yes    Review of Systems  All other systems reviewed and are negative.  Allergies  Bee venom  Home Medications   Prior to Admission medications   Medication Sig Start Date End Date Taking? Authorizing Provider  HYDROcodone-acetaminophen (NORCO) 5-325 MG tablet Take 1 tablet by mouth every 6 (six) hours as needed for severe pain. 07/23/15   Pricilla Loveless, MD  ibuprofen (ADVIL,MOTRIN) 200 MG tablet Take 400 mg by mouth every 6 (six) hours as needed for moderate pain.    Historical Provider, MD  methocarbamol (ROBAXIN) 500 MG tablet Take 1 tablet (500 mg total) by mouth 2 (two) times daily. Patient not taking: Reported on 07/23/2015 02/11/15   Joycie Peek, PA-C  naproxen (NAPROSYN) 500 MG tablet Take 1 tablet (500 mg total) by mouth 2 (two) times daily. Patient  not taking: Reported on 07/23/2015 02/11/15   Joycie Peek, PA-C  traMADol (ULTRAM) 50 MG tablet Take 1 tablet (50 mg total) by mouth every 6 (six) hours as needed. Patient not taking: Reported on 07/23/2015 01/05/15   Linwood Dibbles, MD   BP 107/68 mmHg  Pulse 86  Temp(Src) 97.9 F (36.6 C) (Oral)  Resp 16  Wt 215 lb (97.523 kg)  SpO2 100% Physical Exam  Constitutional: He is oriented to person, place, and time. He appears well-developed and well-nourished. No distress.  HENT:  Head: Normocephalic and atraumatic.  Eyes: Conjunctivae and EOM are normal.  Neck: Neck supple. No tracheal deviation present.  Cardiovascular: Normal rate.   Pulmonary/Chest: Effort normal. No respiratory distress.  Musculoskeletal: Normal range of motion.  Left index finger with 3 intact sutures, simple interrupted. Immediate area surrounding laceration site is ttp. No eythema or edema noted. Intact sensation and ROM.   Neurological: He is alert and oriented to person, place, and time.  Skin: Skin is warm and dry.  Psychiatric: He has a normal mood and affect. His behavior is normal.  Nursing note and vitals reviewed.  ED Course  Procedures (including critical care time) SUTURE REMOVAL Performed by: Noelle Penner  Consent: Verbal consent obtained. Patient identity confirmed: provided demographic data Time out: Immediately prior to procedure a "time out" was called to verify the correct patient, procedure, equipment, support staff and site/side marked as required.  Location details: left index  finger  Wound Appearance: clean  Sutures/Staples Removed: 3  Facility: sutures placed in this facility Patient tolerance: Patient tolerated the procedure well with no immediate complications.     DIAGNOSTIC STUDIES: Oxygen Saturation is 100% on RA, normal by my interpretation.    COORDINATION OF CARE: 12:02 PM-Discussed treatment plan which includes suture removal and Keflex with pt at bedside and pt agreed  to plan.   Labs Review Labs Reviewed - No data to display  Imaging Review No results found. I have personally reviewed and evaluated these images and lab results as part of my medical decision-making.   EKG Interpretation None      MDM   Final diagnoses:  Visit for suture removal    Pt with sutures that were placed on 12/2. Tenderness to area but no erythema, edema, or discharge. Intact sensation and strength. Pt is afebrile. Sutures removed without complication. Pt instructed to take NSAIDs prn. Given such delay in suture removal and new tenderness will give rx for keflex as well. Resource guide given to establish primary care for follow-up within one week. ER return precautions given.   I personally performed the services described in this documentation, which was scribed in my presence. The recorded information has been reviewed and is accurate.   Carlene CoriaSerena Y Zackry Deines, PA-C 08/10/15 1230  Vanetta MuldersScott Zackowski, MD 08/11/15 [phone]

## 2015-08-10 NOTE — ED Notes (Signed)
Pt here for suture removal. Sutures in place X1 month.

## 2015-08-10 NOTE — ED Notes (Signed)
Pt had sutures removed. See PA note for assessment.

## 2015-08-10 NOTE — ED Notes (Signed)
Suture removal kit at bedside 

## 2015-08-10 NOTE — Discharge Instructions (Signed)
Take medications as prescribed. Return to the emergency room for worsening condition or new concerning symptoms. Follow up with your regular doctor. If you don't have a regular doctor use one of the numbers below to establish a primary care doctor. ° ° °Emergency Department Resource Guide °1) Find a Doctor and Pay Out of Pocket °Although you won't have to find out who is covered by your insurance plan, it is a good idea to ask around and get recommendations. You will then need to call the office and see if the doctor you have chosen will accept you as a new patient and what types of options they offer for patients who are self-pay. Some doctors offer discounts or will set up payment plans for their patients who do not have insurance, but you will need to ask so you aren't surprised when you get to your appointment. ° °2) Contact Your Local Health Department °Not all health departments have doctors that can see patients for sick visits, but many do, so it is worth a call to see if yours does. If you don't know where your local health department is, you can check in your phone book. The CDC also has a tool to help you locate your state's health department, and many state websites also have listings of all of their local health departments. ° °3) Find a Walk-in Clinic °If your illness is not likely to be very severe or complicated, you may want to try a walk in clinic. These are popping up all over the country in pharmacies, drugstores, and shopping centers. They're usually staffed by nurse practitioners or physician assistants that have been trained to treat common illnesses and complaints. They're usually fairly quick and inexpensive. However, if you have serious medical issues or chronic medical problems, these are probably not your best option. ° °No Primary Care Doctor: °- Call [facility] at  832-8000 - they can help you locate a primary care doctor that  accepts your insurance, provides certain services,  etc. °- Physician Referral Service- 1-800-533-3463 ° °Emergency Department Resource Guide °1) Find a Doctor and Pay Out of Pocket °Although you won't have to find out who is covered by your insurance plan, it is a good idea to ask around and get recommendations. You will then need to call the office and see if the doctor you have chosen will accept you as a new patient and what types of options they offer for patients who are self-pay. Some doctors offer discounts or will set up payment plans for their patients who do not have insurance, but you will need to ask so you aren't surprised when you get to your appointment. ° °2) Contact Your Local Health Department °Not all health departments have doctors that can see patients for sick visits, but many do, so it is worth a call to see if yours does. If you don't know where your local health department is, you can check in your phone book. The CDC also has a tool to help you locate your state's health department, and many state websites also have listings of all of their local health departments. ° °3) Find a Walk-in Clinic °If your illness is not likely to be very severe or complicated, you may want to try a walk in clinic. These are popping up all over the country in pharmacies, drugstores, and shopping centers. They're usually staffed by nurse practitioners or physician assistants that have been trained to treat common illnesses and complaints. They're usually fairly   quick and inexpensive. However, if you have serious medical issues or chronic medical problems, these are probably not your best option. ° °No Primary Care Doctor: °- Call [facility] at  832-8000 - they can help you locate a primary care doctor that  accepts your insurance, provides certain services, etc. °- Physician Referral Service- 1-800-533-3463 ° °Chronic Pain Problems: °Organization         Address  Phone   Notes  °[facility]  ([phone]) [phone] Patients need to be referred by  their primary care doctor.  ° °Medication Assistance: °Organization         Address  Phone   Notes  °[place] [facility] [address] °[place], [place] [address] ([phone]) 641-8030 --Must be a resident of [place] °-- Must have NO insurance coverage whatsoever (no Medicaid/ Medicare, etc.) °-- The pt. MUST have a primary care doctor that directs their care regularly and follows them in the community °  °[facility]  (866) 331-1348   °[employer]  (888) 892-1162   ° °Agencies that provide inexpensive medical care: °Organization         Address  Phone   Notes  °[facility]  ([phone]) [phone]   °[facility]    (336) 832-7272   °[facility] [address] °[place], [place] [address] ([phone]) [phone]   °[facility] [address], °[place] (336) [phone]   °[facility]    (336) 373-0678   °[facility]    (336) 272-1050   °[facility] ° [address], [place] Phone:  (336) 832-4444, Fax:  (336) [phone] Hours of Operation:  9 am - 6 pm, M-F.  Also accepts Medicaid/Medicare and self-pay.  °[facility] ° [address], [place] Phone: (336) [phone], Fax: ([phone]) [phone]. Hours of Operation:  8:30 am - 5:30 pm, M-F.  Also accepts Medicaid and self-pay.  °[facility] [place] [address], [place] Phone: ([phone]) [phone]   °[facility] [address], [place], [place] (336)723-1848, Ext. 123 Mondays & Thursdays: 7-9 AM.  First 15 patients are seen on a first come, first serve basis. °  ° °Medicaid-accepting [place] Providers: ° °Organization         Address  Phone   Notes  °[facility] [address], [place] ([phone]) [phone] Also accepts self-pay patients.  °[facility] [address], [place] ° ([phone]) [phone]   °[facility]  [address], [place] ([phone]) [phone]   °[facility] [address] [place] [address], [place] ([phone]) [phone]   °[facility] [address], [place]  ° ([phone]) [phone] Only accepts [place] Access Medicaid patients after they have their name applied to their card.  ° °Self-Pay (no insurance) in [place]: ° °Organization         Address  Phone   Notes  °Sickle Cell Patients, [facility] [address], [place] ([phone]) [phone]   °[facility] [address], [place] ([phone]) [phone]   °[facility] ° [address],  ([phone]) [phone]   °[facility]/Dr. Osei-Bonsu ° [address] [place] [address], [place] or [address], [place] [phone]) [phone] Phone number   for both [place] and [place] locations is the same.  °[facility] [address], [place] ([phone]) [phone]   °[facility] [address] [place] [address], [place] or [address] ([phone]) [phone] °(336) [phone]   °[facility] [address], [place] (336) [phone], phone; ([phone]) [phone], fax Sees patients 1st and 3rd Saturday of every month.  Must not qualify for public or private insurance (i.e. Medicaid, Medicare, [place] Health Choice, Veterans' Benefits) • Household income should be no more than 200% of the poverty level •The clinic cannot treat you if you are pregnant or think you are pregnant • Sexually transmitted diseases are not treated at the clinic.  ° ° ° °

## 2015-11-02 ENCOUNTER — Encounter (HOSPITAL_COMMUNITY): Payer: Self-pay | Admitting: Emergency Medicine

## 2015-11-02 ENCOUNTER — Emergency Department (HOSPITAL_COMMUNITY)
Admission: EM | Admit: 2015-11-02 | Discharge: 2015-11-02 | Disposition: A | Discharge status: Home / Self Care | Payer: No Typology Code available for payment source | Attending: Emergency Medicine | Admitting: Emergency Medicine

## 2015-11-02 DIAGNOSIS — F17200 Nicotine dependence, unspecified, uncomplicated: Secondary | ICD-10-CM | POA: Insufficient documentation

## 2015-11-02 DIAGNOSIS — R062 Wheezing: Secondary | ICD-10-CM | POA: Insufficient documentation

## 2015-11-02 DIAGNOSIS — M545 Low back pain, unspecified: Secondary | ICD-10-CM

## 2015-11-02 DIAGNOSIS — Z792 Long term (current) use of antibiotics: Secondary | ICD-10-CM | POA: Insufficient documentation

## 2015-11-02 DIAGNOSIS — R51 Headache: Secondary | ICD-10-CM | POA: Insufficient documentation

## 2015-11-02 DIAGNOSIS — G8929 Other chronic pain: Secondary | ICD-10-CM | POA: Insufficient documentation

## 2015-11-02 MED ORDER — IBUPROFEN 400 MG PO TABS
800.0000 mg | ORAL_TABLET | Freq: Once | ORAL | Status: AC
  Administered 2015-11-02: 800 mg via ORAL
  Filled 2015-11-02: qty 2

## 2015-11-02 MED ORDER — MELOXICAM 7.5 MG PO TABS: 15.0000 mg | ORAL_TABLET | Freq: Every day | ORAL | Status: DC

## 2015-11-02 NOTE — Discharge Instructions (Signed)
Take your meloxicam as prescribed. Do not take his medication income in edition with ibuprofen, Motrin, Advil or other NSAIDs as they work similarly. Follow-up with your doctor or the community [facility] to establish primary care. Return to ED for any new or worsening symptoms.  Chronic Back Pain  When back pain lasts longer than 3 months, it is called chronic back pain.People with chronic back pain often go through certain periods that are more intense (flare-ups).  CAUSES Chronic back pain can be caused by wear and tear (degeneration) on different structures in your back. These structures include:  The bones of your spine (vertebrae) and the joints surrounding your spinal cord and nerve roots (facets).  The strong, fibrous tissues that connect your vertebrae (ligaments). Degeneration of these structures may result in pressure on your nerves. This can lead to constant pain. HOME CARE INSTRUCTIONS  Avoid bending, heavy lifting, prolonged sitting, and activities which make the problem worse.  Take brief periods of rest throughout the day to reduce your pain. Lying down or standing usually is better than sitting while you are resting.  Take over-the-counter or prescription medicines only as directed by your caregiver. SEEK IMMEDIATE MEDICAL CARE IF:   You have weakness or numbness in one of your legs or feet.  You have trouble controlling your bladder or bowels.  You have nausea, vomiting, abdominal pain, shortness of breath, or fainting.   This information is not intended to replace advice given to you by your health care provider. Make sure you discuss any questions you have with your health care provider.   Document Released: 09/14/2004 Document Revised: 10/30/2011 Document Reviewed: 01/25/2015 Elsevier Interactive Patient Education [employer]2016 [employer] Inc.

## 2015-11-02 NOTE — ED Notes (Signed)
C/o LBP that radiates up his back, onset 1 week ago. Hx same off and on in the past. ALSO, c/o headache which started yesterday. States he has had ha's since he was a child.

## 2015-11-02 NOTE — ED Provider Notes (Signed)
History  By signing my name below, I, Karle Plumber, attest that this documentation has been prepared under the direction and in the presence of [employer], PA-C. Electronically Signed: Karle Plumber, ED Scribe. 11/02/2015. 12:21 PM.  Chief Complaint  Patient presents with  . Back Pain  . Headache   The history is provided by the patient and medical records. No language interpreter was used.    HPI Comments:  Stephen Ward is a 35 y.o. male who presents to the Emergency Department complaining of chronic lower back pain that started worsening about one week ago. Pt states he has been evaluated here for this multiple times in the past. He reports associated chronic HA that he has been having since childhood. He has been taking Ibuprofen with minimal relief of the pain. Moving and walking increases his back pain. He denies alleviating factors. He denies fever, chills, numbness, tingling or weakness of the lower extremities, nausea, vomiting, abdominal pain, bowel or bladder incontinence, saddle anesthesia. He denies h/o IV drug use or cancer. He is a smoker. He does not have a PCP.  No past medical history on file. No past surgical history on file. Family History  Problem Relation Age of Onset  . Seizures Mother    Social History  Substance Use Topics  . Smoking status: Current Every Day Smoker  . Smokeless tobacco: Not on file  . Alcohol Use: Yes    Review of Systems A complete 10 system review of systems was obtained and all systems are negative except as noted in the HPI and PMH.   Allergies  Bee venom  Home Medications   Prior to Admission medications   Medication Sig Start Date End Date Taking? Authorizing Provider  cephALEXin (KEFLEX) 500 MG capsule Take 1 capsule (500 mg total) by mouth 3 (three) times daily. 08/10/15   Carlene Coria, PA-C  HYDROcodone-acetaminophen (NORCO) 5-325 MG tablet Take 1 tablet by mouth every 6 (six) hours as needed for severe pain.  07/23/15   Pricilla Loveless, MD  ibuprofen (ADVIL,MOTRIN) 200 MG tablet Take 400 mg by mouth every 6 (six) hours as needed for moderate pain.    Historical Provider, MD  meloxicam (MOBIC) 7.5 MG tablet Take 2 tablets (15 mg total) by mouth daily. 11/02/15   Joycie Peek, PA-C  methocarbamol (ROBAXIN) 500 MG tablet Take 1 tablet (500 mg total) by mouth 2 (two) times daily. Patient not taking: Reported on 07/23/2015 02/11/15   Joycie Peek, PA-C  naproxen (NAPROSYN) 500 MG tablet Take 1 tablet (500 mg total) by mouth 2 (two) times daily. Patient not taking: Reported on 07/23/2015 02/11/15   Joycie Peek, PA-C  traMADol (ULTRAM) 50 MG tablet Take 1 tablet (50 mg total) by mouth every 6 (six) hours as needed. Patient not taking: Reported on 07/23/2015 01/05/15   Linwood Dibbles, MD   Triage Vitals: BP 108/77 mmHg  Pulse 79  Temp(Src) 98.4 F (36.9 C) (Oral)  Resp 18  Ht  (1.702 m)  Wt 209 lb 6.4 oz (94.983 kg)  BMI 32.79 kg/m2  SpO2 98% Physical Exam  Constitutional: He is oriented to person, place, and time. He appears well-developed and well-nourished.  HENT:  Head: Normocephalic and atraumatic.  Eyes: EOM are normal.  Neck: Normal range of motion.  Cardiovascular: Normal rate, regular rhythm and normal heart sounds.  Exam reveals no gallop and no friction rub.   No murmur heard. Pulmonary/Chest: Effort normal. He has wheezes (mild wheezing in left lower lobe).  Abdominal: Soft. There is no tenderness.  Musculoskeletal: Normal range of motion.  Mild paraspinal lumbar tenderness to palpation. No midline bony tenderness. Full active range of motion of thoracic and lumbar spine as well as bilateral lower extremities. No crepitus, lesions or other abnormalities.  Neurological: He is alert and oriented to person, place, and time.  Motor strength and sensation are intact and baseline. Able to plantar and dorsiflex great toes as well as step on tiptoes. Gait baseline  Skin: Skin is warm and  dry.  Psychiatric: He has a normal mood and affect. His behavior is normal.  Nursing note and vitals reviewed.   ED Course  Procedures (including critical care time) DIAGNOSTIC STUDIES: Oxygen Saturation is 98% on RA, normal by my interpretation.   COORDINATION OF CARE: 12:19 PM- Will prescribe Meloxicam and give referral to establish care with PCP. Pt verbalizes understanding and agrees to plan.  Medications  ibuprofen (ADVIL,MOTRIN) tablet 800 mg (800 mg Oral Given 11/02/15 1250)    Labs Review Labs Reviewed - No data to display  Imaging Review No results found. I have personally reviewed and evaluated these images and lab results as part of my medical decision-making.   EKG Interpretation None     Meds given in ED:  Medications  ibuprofen (ADVIL,MOTRIN) tablet 800 mg (800 mg Oral Given 11/02/15 1250)    Discharge Medication List as of 11/02/2015 12:44 PM    START taking these medications   Details  meloxicam (MOBIC) 7.5 MG tablet Take 2 tablets (15 mg total) by mouth daily., Starting 11/02/2015, Until Discontinued, Print       Filed Vitals:   11/02/15 1052  BP: 108/77  Pulse: 79  Temp: 98.4 F (36.9 C)  TempSrc: Oral  Resp: 18  Height: 5\' 7"  (1.702 m)  Weight: 94.983 kg  SpO2: 98%    MDM  Stephen Ward is a 35 y.o. male presents for evaluation of acute exacerbation of chronic back pain and headache. Patient reports she has had these problems ongoing ever many years, since he was a child. On arrival, he is hemodynamically stable and afebrile. Exam with musculoskeletal tenderness, nonfocal neuro exam. Discussed we'll treat with symptomatic support, referral for PCP follow-up. No pathologic back pain red flags. No acute objective findings today. Patient stable for discharge and outpatient follow-up. He verbalizes understanding and agrees with this plan. Return precautions discussed. Final diagnoses:  Chronic low back pain    I personally performed the  services described in this documentation, which was scribed in my presence. The recorded information has been reviewed and is accurate.     Joycie PeekBenjamin Lluvia Gwynne, PA-C 11/02/15 1502  Alvira MondayErin Schlossman, MD 11/03/15 1236

## 2015-11-02 NOTE — ED Notes (Signed)
Pt sts he has pain from tail bone up to shoulder. Pt has had this pain in the past and has been seen in ED for it. Pt also has HA and has hx of same. Pt received "shot" last time in ED that did help. No acute distress noted.

## 2016-01-05 ENCOUNTER — Emergency Department (HOSPITAL_COMMUNITY)
Admission: EM | Admit: 2016-01-05 | Discharge: 2016-01-05 | Disposition: A | Discharge status: Home / Self Care | Payer: No Typology Code available for payment source | Attending: Emergency Medicine | Admitting: Emergency Medicine

## 2016-01-05 ENCOUNTER — Encounter (HOSPITAL_COMMUNITY): Payer: Self-pay | Admitting: Family Medicine

## 2016-01-05 DIAGNOSIS — Z792 Long term (current) use of antibiotics: Secondary | ICD-10-CM | POA: Insufficient documentation

## 2016-01-05 DIAGNOSIS — L03114 Cellulitis of left upper limb: Secondary | ICD-10-CM | POA: Insufficient documentation

## 2016-01-05 DIAGNOSIS — F17200 Nicotine dependence, unspecified, uncomplicated: Secondary | ICD-10-CM | POA: Insufficient documentation

## 2016-01-05 DIAGNOSIS — Z791 Long term (current) use of non-steroidal anti-inflammatories (NSAID): Secondary | ICD-10-CM | POA: Insufficient documentation

## 2016-01-05 MED ORDER — NAPROXEN 500 MG PO TABS: 500.0000 mg | ORAL_TABLET | Freq: Two times a day (BID) | ORAL | Status: DC

## 2016-01-05 MED ORDER — CEPHALEXIN 500 MG PO CAPS: 500.0000 mg | ORAL_CAPSULE | Freq: Three times a day (TID) | ORAL | Status: DC

## 2016-01-05 NOTE — ED Notes (Signed)
Pt here for bug bite to left arm and elbow. Redness and swelling.

## 2016-01-05 NOTE — Discharge Instructions (Signed)
Return in 48 hrs if you notice no improvement of your symptom despite taking antibiotic.    Cellulitis Cellulitis is an infection of the skin and the tissue under the skin. The infected area is usually red and tender. This happens most often in the arms and lower legs. HOME CARE   Take your antibiotic medicine as told. Finish the medicine even if you start to feel better.  Keep the infected arm or leg raised (elevated).  Put a warm cloth on the area up to 4 times per day.  Only take medicines as told by your doctor.  Keep all doctor visits as told. GET HELP IF:  You see red streaks on the skin coming from the infected area.  Your red area gets bigger or turns a dark color.  Your bone or joint under the infected area is painful after the skin heals.  Your infection comes back in the same area or different area.  You have a puffy (swollen) bump in the infected area.  You have new symptoms.  You have a fever. GET HELP RIGHT AWAY IF:   You feel very sleepy.  You throw up (vomit) or have watery poop (diarrhea).  You feel sick and have muscle aches and pains.   This information is not intended to replace advice given to you by your health care provider. Make sure you discuss any questions you have with your health care provider.   Document Released: 01/24/2008 Document Revised: 04/28/2015 Document Reviewed: 10/23/2011 Elsevier Interactive Patient Education [employer]2016 Elsevier Inc.

## 2016-01-05 NOTE — ED Provider Notes (Signed)
History  By signing my name below, I, Earmon Phoenix, attest that this documentation has been prepared under the direction and in the presence of Fayrene Helper, PA-C. Electronically Signed: Earmon Phoenix, ED Scribe. 01/05/2016. 2:33 PM.  Chief Complaint  Patient presents with  . Abscess   The history is provided by the patient and medical records. No language interpreter was used.    HPI Comments:  Stephen Ward is a 35 y.o. male who presents to the Emergency Department complaining of an unknown insect bite to the left elbow that occurred two days ago. He reports associated itching and burning. He rates the pain at 8/10. He has not done anything to treat the symptoms. He denies modifying factors. He denies fever, chills, nausea, vomiting.   History reviewed. No pertinent past medical history. History reviewed. No pertinent past surgical history. Family History  Problem Relation Age of Onset  . Seizures Mother    Social History  Substance Use Topics  . Smoking status: Current Every Day Smoker  . Smokeless tobacco: None  . Alcohol Use: Yes    Review of Systems  Constitutional: Negative for fever and chills.  Gastrointestinal: Negative for nausea and vomiting.  Skin: Positive for color change.    Allergies  Bee venom  Home Medications   Prior to Admission medications   Medication Sig Start Date End Date Taking? Authorizing Provider  cephALEXin (KEFLEX) 500 MG capsule Take 1 capsule (500 mg total) by mouth 3 (three) times daily. 08/10/15   Carlene Coria, PA-C  HYDROcodone-acetaminophen (NORCO) 5-325 MG tablet Take 1 tablet by mouth every 6 (six) hours as needed for severe pain. 07/23/15   Pricilla Loveless, MD  ibuprofen (ADVIL,MOTRIN) 200 MG tablet Take 400 mg by mouth every 6 (six) hours as needed for moderate pain.    Historical Provider, MD  meloxicam (MOBIC) 7.5 MG tablet Take 2 tablets (15 mg total) by mouth daily. 11/02/15   Joycie Peek, PA-C  methocarbamol  (ROBAXIN) 500 MG tablet Take 1 tablet (500 mg total) by mouth 2 (two) times daily. Patient not taking: Reported on 07/23/2015 02/11/15   Joycie Peek, PA-C  naproxen (NAPROSYN) 500 MG tablet Take 1 tablet (500 mg total) by mouth 2 (two) times daily. Patient not taking: Reported on 07/23/2015 02/11/15   Joycie Peek, PA-C  traMADol (ULTRAM) 50 MG tablet Take 1 tablet (50 mg total) by mouth every 6 (six) hours as needed. Patient not taking: Reported on 07/23/2015 01/05/15   Linwood Dibbles, MD   Triage Vitals: BP 121/75 mmHg  Pulse 83  Temp(Src) 98.5 F (36.9 C) (Oral)  Resp 16  SpO2 100% Physical Exam  Constitutional: He is oriented to person, place, and time. He appears well-developed and well-nourished.  HENT:  Head: Normocephalic and atraumatic.  Eyes: EOM are normal.  Neck: Normal range of motion.  Cardiovascular: Normal rate.   Left radial pulse 2+.  Pulmonary/Chest: Effort normal.  Musculoskeletal: Normal range of motion.  Left upper arm with area of erythema and edema noted to dorsal aspect to distal upper arm adjacent to elbow without joint involvement. Warmth noted. No obvious fluctuance. Left elbow with normal flexion and extension. Normal grip strength. Left upper arm compartments soft.  Neurological: He is alert and oriented to person, place, and time.  Skin: Skin is warm and dry.  Psychiatric: He has a normal mood and affect. His behavior is normal.  Nursing note and vitals reviewed.   ED Course  Procedures (including critical care time) DIAGNOSTIC  STUDIES: Oxygen Saturation is 100% on RA, normal by my interpretation.   COORDINATION OF CARE: 2:33 PM- cellulitis of L upper arm.  No septic joint.  No abscess.  Will prescribe antibiotics. Return precautions discussed. Pt verbalizes understanding and agrees to plan.   MDM   Final diagnoses:  Cellulitis of left upper arm    BP 121/75 mmHg  Pulse 83  Temp(Src) 98.5 F (36.9 C) (Oral)  Resp 16  SpO2 100%   I  personally performed the services described in this documentation, which was scribed in my presence. The recorded information has been reviewed and is accurate.       Fayrene HelperBowie Karina Nofsinger, PA-C 01/05/16 1436  Melene Planan Floyd, DO 01/05/16 [phone]

## 2016-05-15 ENCOUNTER — Emergency Department (HOSPITAL_COMMUNITY): Payer: Self-pay

## 2016-05-15 ENCOUNTER — Encounter (HOSPITAL_COMMUNITY): Payer: Self-pay | Admitting: Emergency Medicine

## 2016-05-15 ENCOUNTER — Emergency Department (HOSPITAL_COMMUNITY)
Admission: EM | Admit: 2016-05-15 | Discharge: 2016-05-15 | Disposition: A | Discharge status: Home / Self Care | Payer: Self-pay | Attending: Emergency Medicine | Admitting: Emergency Medicine

## 2016-05-15 DIAGNOSIS — R079 Chest pain, unspecified: Secondary | ICD-10-CM | POA: Insufficient documentation

## 2016-05-15 DIAGNOSIS — Z5321 Procedure and treatment not carried out due to patient leaving prior to being seen by health care provider: Secondary | ICD-10-CM | POA: Insufficient documentation

## 2016-05-15 LAB — BASIC METABOLIC PANEL
Anion gap: 9 (ref 5–15)
BUN: 7 mg/dL (ref 6–20)
CALCIUM: 9.3 mg/dL (ref 8.9–10.3)
CO2: 24 mmol/L (ref 22–32)
Chloride: 104 mmol/L (ref 101–111)
Creatinine, Ser: 0.97 mg/dL (ref 0.61–1.24)
GFR calc Af Amer: >60 mL/min (ref >60)
Glucose, Bld: 83 mg/dL (ref 65–99)
POTASSIUM: 4.2 mmol/L (ref 3.5–5.1)
SODIUM: 137 mmol/L (ref 135–145)

## 2016-05-15 LAB — CBC
HEMATOCRIT: 48.8 % (ref 39.0–52.0)
Hemoglobin: 16.2 g/dL (ref 13.0–17.0)
MCH: 29.8 pg (ref 26.0–34.0)
MCHC: 33.2 g/dL (ref 30.0–36.0)
MCV: 89.9 fL (ref 78.0–100.0)
PLATELETS: 301 10*3/uL (ref 150–400)
RBC: 5.43 MIL/uL (ref 4.22–5.81)
RDW: 13.5 % (ref 11.5–15.5)
WBC: 8.1 10*3/uL (ref 4.0–10.5)

## 2016-05-15 LAB — I-STAT TROPONIN, ED: TROPONIN I, POC: 0.01 ng/mL (ref 0.00–0.08)

## 2016-05-15 NOTE — ED Notes (Signed)
Spoke with patient, He stated that he could not wait. Informed him if anything changed to come back. He thank me for my time. Patient then,walked out.

## 2016-05-15 NOTE — ED Triage Notes (Signed)
Cp since sat feels  squeeezing in chest ,  Sharp pain that come and go , no n/v , hurts to take a deep breath

## 2017-01-03 ENCOUNTER — Emergency Department (HOSPITAL_COMMUNITY)
Admission: EM | Admit: 2017-01-03 | Discharge: 2017-01-03 | Disposition: A | Discharge status: Home / Self Care | Payer: No Typology Code available for payment source | Attending: Emergency Medicine | Admitting: Emergency Medicine

## 2017-01-03 DIAGNOSIS — F17200 Nicotine dependence, unspecified, uncomplicated: Secondary | ICD-10-CM | POA: Insufficient documentation

## 2017-01-03 DIAGNOSIS — T7840XA Allergy, unspecified, initial encounter: Secondary | ICD-10-CM | POA: Insufficient documentation

## 2017-01-03 MED ORDER — PREDNISONE 10 MG (21) PO TBPK: ORAL_TABLET | ORAL | 0 refills | Status: DC

## 2017-01-03 MED ORDER — HYDROCORTISONE 1 % EX CREA
TOPICAL_CREAM | Freq: Once | CUTANEOUS | Status: AC
  Administered 2017-01-03: 14:00:00 via TOPICAL
  Filled 2017-01-03: qty 28

## 2017-01-03 MED ORDER — PREDNISONE 20 MG PO TABS
60.0000 mg | ORAL_TABLET | Freq: Once | ORAL | Status: AC
  Administered 2017-01-03: 60 mg via ORAL
  Filled 2017-01-03: qty 3

## 2017-01-03 MED ORDER — HYDROXYZINE HCL 25 MG PO TABS: 25.0000 mg | ORAL_TABLET | Freq: Four times a day (QID) | ORAL | 0 refills | Status: DC

## 2017-01-03 MED ORDER — DIPHENHYDRAMINE HCL 25 MG PO CAPS
25.0000 mg | ORAL_CAPSULE | Freq: Once | ORAL | Status: AC
  Administered 2017-01-03: 25 mg via ORAL
  Filled 2017-01-03: qty 1

## 2017-01-03 NOTE — ED Provider Notes (Signed)
[facility]-EMERGENCY DEPT Provider Note   CSN: [id number] Arrival date & time: 01/03/17  1250  By signing my name below, I, Marnette Burgess Long, attest that this documentation has been prepared under the direction and in the presence of Jacalyn Lefevre, MD. Electronically Signed: Marnette Burgess Long, Scribe. 01/03/2017. 1:23 PM.  History   Chief Complaint No chief complaint on file.  The history is provided by the patient and medical records. No language interpreter was used.    HPI Comments:  Stephen Ward is a 36 y.o. male with no pertinent PMHx, who presents to the Emergency Department complaining of sudden onset 7/10 pain to the left foot onset yesterday. Pt reports being outside with his friends when he felt a bite to the left foot. He notes associated itching and redness to the area since that time. Pt is ambulatory without difficulty. He tried soaking the foot in a tub with hot water without relief. Pt denies swelling, drainage from the area and any other complaints at this time.  He has not taken any meds for sx.  No past medical history on file.  There are no active problems to display for this patient.  No past surgical history on file.  Home Medications    Prior to Admission medications   Medication Sig Start Date End Date Taking? Authorizing Provider  cephALEXin (KEFLEX) 500 MG capsule Take 1 capsule (500 mg total) by mouth 3 (three) times daily. 01/05/16   Fayrene Helper, PA-C  HYDROcodone-acetaminophen (NORCO) 5-325 MG tablet Take 1 tablet by mouth every 6 (six) hours as needed for severe pain. 07/23/15   Pricilla Loveless, MD  hydrOXYzine (ATARAX/VISTARIL) 25 MG tablet Take 1 tablet (25 mg total) by mouth every 6 (six) hours. 01/03/17   Jacalyn Lefevre, MD  meloxicam (MOBIC) 7.5 MG tablet Take 2 tablets (15 mg total) by mouth daily. 11/02/15   Cartner, Sharlet Salina, PA-C  methocarbamol (ROBAXIN) 500 MG tablet Take 1 tablet (500 mg total) by mouth 2 (two) times daily. Patient not taking:  Reported on 07/23/2015 02/11/15   Joycie Peek, PA-C  naproxen (NAPROSYN) 500 MG tablet Take 1 tablet (500 mg total) by mouth 2 (two) times daily. 01/05/16   Fayrene Helper, PA-C  predniSONE (STERAPRED UNI-PAK 21 TAB) 10 MG (21) TBPK tablet Take 6 tabs by mouth daily  for 2 days, then 5 tabs for 2 days, then 4 tabs for 2 days, then 3 tabs for 2 days, 2 tabs for 2 days, then 1 tab by mouth daily for 2 days 01/03/17   Jacalyn Lefevre, MD  traMADol (ULTRAM) 50 MG tablet Take 1 tablet (50 mg total) by mouth every 6 (six) hours as needed. Patient not taking: Reported on 07/23/2015 01/05/15   Linwood Dibbles, MD    Family History Family History  Problem Relation Age of Onset  . Seizures Mother     Social History Social History  Substance Use Topics  . Smoking status: Current Every Day Smoker  . Smokeless tobacco: Never Used  . Alcohol use Yes     Allergies   Bee venom   Review of Systems Review of Systems All systems reviewed and are negative for acute change except as noted in the HPI.    Physical Exam Updated Vital Signs BP 112/82 (BP Location: Right Arm)   Pulse 90   Temp 98.5 F (36.9 C) (Oral)   Resp 14   Ht 5\' 7"  (1.702 m)   Wt 210 lb (95.3 kg)   SpO2 98%  BMI 32.89 kg/m   Physical Exam  Constitutional: He is oriented to person, place, and time. He appears well-developed and well-nourished.  HENT:  Head: Normocephalic.  Eyes: Conjunctivae are normal.  Cardiovascular: Normal rate.   Pulmonary/Chest: Effort normal.  Abdominal: He exhibits no distension.  Musculoskeletal: Normal range of motion.  Neurological: He is alert and oriented to person, place, and time.  Skin: Skin is warm and dry.  Redness and irritation to the top of the left foot.  Psychiatric: He has a normal mood and affect.  Nursing note and vitals reviewed.    ED Treatments / Results  DIAGNOSTIC STUDIES:  Oxygen Saturation is 98% on RA, normal by my interpretation.    COORDINATION OF  CARE:  1:21 PM Discussed treatment plan with pt at bedside including hydrocortisone cream, Benadryl, and Prednisone and pt agreed to plan.  Labs (all labs ordered are listed, but only abnormal results are displayed) Labs Reviewed - No data to display  EKG  EKG Interpretation None       Radiology No results found.  Procedures Procedures (including critical care time)  Medications Ordered in ED Medications  hydrocortisone cream 1 % (not administered)  diphenhydrAMINE (BENADRYL) capsule 25 mg (not administered)  predniSONE (DELTASONE) tablet 60 mg (not administered)     Initial Impression / Assessment and Plan / ED Course  I have reviewed the triage vital signs and the nursing notes.  Pertinent labs & imaging results that were available during my care of the patient were reviewed by me and considered in my medical decision making (see chart for details).   Pt will be treated as an allergic response.  He knows to return if worse.   Final Clinical Impressions(s) / ED Diagnoses   Final diagnoses:  Allergic response, initial encounter    New Prescriptions New Prescriptions   HYDROXYZINE (ATARAX/VISTARIL) 25 MG TABLET    Take 1 tablet (25 mg total) by mouth every 6 (six) hours.   PREDNISONE (STERAPRED UNI-PAK 21 TAB) 10 MG (21) TBPK TABLET    Take 6 tabs by mouth daily  for 2 days, then 5 tabs for 2 days, then 4 tabs for 2 days, then 3 tabs for 2 days, 2 tabs for 2 days, then 1 tab by mouth daily for 2 days   I personally performed the services described in this documentation, which was scribed in my presence. The recorded information has been reviewed and is accurate.     Jacalyn LefevreHaviland, Lelania Bia, MD 01/03/17 1326

## 2017-01-03 NOTE — ED Triage Notes (Signed)
Pt reports being outside yesterday with friends and feeling a bite to the L foot, pt reports itching and redness since that time, no obvious bite mark, top of L foot is red, no swelling or drainage  Noted, pt ambulatory, A&O x4

## 2017-02-19 ENCOUNTER — Encounter (HOSPITAL_COMMUNITY): Payer: Self-pay

## 2017-02-19 ENCOUNTER — Emergency Department (HOSPITAL_COMMUNITY)
Admission: EM | Admit: 2017-02-19 | Discharge: 2017-02-19 | Discharge status: Against Medical Advice | Payer: No Typology Code available for payment source | Attending: Emergency Medicine | Admitting: Emergency Medicine

## 2017-02-19 DIAGNOSIS — Z5321 Procedure and treatment not carried out due to patient leaving prior to being seen by health care provider: Secondary | ICD-10-CM | POA: Insufficient documentation

## 2017-02-19 DIAGNOSIS — M545 Low back pain: Secondary | ICD-10-CM | POA: Insufficient documentation

## 2017-02-19 HISTORY — DX: Headache: R51

## 2017-02-19 HISTORY — DX: Dorsalgia, unspecified: M54.9

## 2017-02-19 HISTORY — DX: Headache, unspecified: R51.9

## 2017-02-19 MED ORDER — OXYCODONE-ACETAMINOPHEN 5-325 MG PO TABS
ORAL_TABLET | ORAL | Status: DC
Start: 2017-02-19 — End: 2017-02-19
  Filled 2017-02-19: qty 1

## 2017-02-19 MED ORDER — OXYCODONE-ACETAMINOPHEN 5-325 MG PO TABS
1.0000 | ORAL_TABLET | ORAL | Status: DC | PRN
  Administered 2017-02-19: 1 via ORAL

## 2017-02-19 NOTE — ED Notes (Signed)
Provided stickers and told patient has left the building.  Will d/c.

## 2017-02-19 NOTE — ED Triage Notes (Signed)
Per Pt, Pt is coming from home with severe lower back pain. Pt reports having pain in his tailbone that radiates bilaterally to his thighs. Pt has hx of the same.

## 2017-02-19 NOTE — ED Notes (Signed)
Pt ripped armband off and left deparment

## 2017-02-20 ENCOUNTER — Encounter (HOSPITAL_COMMUNITY): Payer: Self-pay | Admitting: Emergency Medicine

## 2017-02-20 ENCOUNTER — Emergency Department (HOSPITAL_COMMUNITY)
Admission: EM | Admit: 2017-02-20 | Discharge: 2017-02-20 | Disposition: A | Discharge status: Home / Self Care | Payer: No Typology Code available for payment source | Attending: Emergency Medicine | Admitting: Emergency Medicine

## 2017-02-20 DIAGNOSIS — F17200 Nicotine dependence, unspecified, uncomplicated: Secondary | ICD-10-CM | POA: Insufficient documentation

## 2017-02-20 DIAGNOSIS — M5431 Sciatica, right side: Secondary | ICD-10-CM | POA: Insufficient documentation

## 2017-02-20 MED ORDER — IBUPROFEN 800 MG PO TABS
800.0000 mg | ORAL_TABLET | Freq: Once | ORAL | Status: AC
  Administered 2017-02-20: 800 mg via ORAL
  Filled 2017-02-20: qty 1

## 2017-02-20 MED ORDER — CYCLOBENZAPRINE HCL 10 MG PO TABS
10.0000 mg | ORAL_TABLET | Freq: Once | ORAL | Status: AC
  Administered 2017-02-20: 10 mg via ORAL
  Filled 2017-02-20: qty 1

## 2017-02-20 MED ORDER — CYCLOBENZAPRINE HCL 10 MG PO TABS: 10.0000 mg | ORAL_TABLET | Freq: Two times a day (BID) | ORAL | 0 refills | Status: DC | PRN

## 2017-02-20 NOTE — Discharge Instructions (Signed)
Please continue to apply ice to her low back for 15-20 minutes up to 3-4 times a day. You can take 800 mg of ibuprofen with food every 8 hours as needed for pain and inflammation control. You can take Flexeril up to 2 times a day as needed for muscle spasms. Please use caution with this medication because it can make you sleepy if you have to drive or work. Please use caution with exercising or lifting weights until pain improves, because weightlifting can aggravate her symptoms. If he develop new or worsening symptoms including a high fever despite Tylenol, numbness and weakness in both legs, or other severe symptoms despite treatment, please return to the emergency department for reevaluation.

## 2017-02-20 NOTE — ED Provider Notes (Signed)
[facility]-EMERGENCY DEPT Provider Note   CSN: [id number] Arrival date & time: 02/20/17  [id number]  By signing my name below, I, Thelma Bargeick Cochran, attest that this documentation has been prepared [id number] the direction and in the presence of Weslyn Holsonback, PA-C. Electronically Signed: Thelma BargeNick Cochran, Scribe. 02/20/17. 10:43 AM.  History   Chief Complaint Chief Complaint  Patient presents with  . Back Pain   The history is provided by the patient. No language interpreter was used.    HPI Comments: Stephen Ward is a 36 y.o. male with chronic back pain who presents to the Emergency Department complaining of constant, gradually worsening lower back pain for 2 days. He states it radiates to his right upper leg. He describes it as a scratching and digging into his back. He has tried using icy hot with mild to no relief. He reports he exercises often. Pt came to the ED yesterday but left because the wait was too long. He denies any knee pain on the right leg. He denies any recent falls or injuries, but reports that he has been regularly weight lifting at the gym. Pt has no PCP.  Past Medical History:  Diagnosis Date  . Back pain   . Headache     There are no active problems to display for this patient.   History reviewed. No pertinent surgical history.     Home Medications    Prior to Admission medications   Medication Sig Start Date End Date Taking? Authorizing Provider  cephALEXin (KEFLEX) 500 MG capsule Take 1 capsule (500 mg total) by mouth 3 (three) times daily. 01/05/16   Fayrene Helperran, Bowie, PA-C  cyclobenzaprine (FLEXERIL) 10 MG tablet Take 1 tablet (10 mg total) by mouth 2 (two) times daily as needed for muscle spasms. 02/20/17   Donnette Macmullen A, PA-C  HYDROcodone-acetaminophen (NORCO) 5-325 MG tablet Take 1 tablet by mouth every 6 (six) hours as needed for severe pain. 07/23/15   Pricilla LovelessGoldston, Scott, MD  hydrOXYzine (ATARAX/VISTARIL) 25 MG tablet Take 1 tablet (25 mg total) by mouth every 6 (six) hours.  01/03/17   Jacalyn LefevreHaviland, Julie, MD  meloxicam (MOBIC) 7.5 MG tablet Take 2 tablets (15 mg total) by mouth daily. 11/02/15   Cartner, Sharlet SalinaBenjamin, PA-C  methocarbamol (ROBAXIN) 500 MG tablet Take 1 tablet (500 mg total) by mouth 2 (two) times daily. Patient not taking: Reported on 07/23/2015 02/11/15   Joycie Peekartner, Benjamin, PA-C  naproxen (NAPROSYN) 500 MG tablet Take 1 tablet (500 mg total) by mouth 2 (two) times daily. 01/05/16   Fayrene Helperran, Bowie, PA-C  predniSONE (STERAPRED UNI-PAK 21 TAB) 10 MG (21) TBPK tablet Take 6 tabs by mouth daily  for 2 days, then 5 tabs for 2 days, then 4 tabs for 2 days, then 3 tabs for 2 days, 2 tabs for 2 days, then 1 tab by mouth daily for 2 days 01/03/17   Jacalyn LefevreHaviland, Julie, MD  traMADol (ULTRAM) 50 MG tablet Take 1 tablet (50 mg total) by mouth every 6 (six) hours as needed. Patient not taking: Reported on 07/23/2015 01/05/15   Linwood DibblesKnapp, Jon, MD    Family History Family History  Problem Relation Age of Onset  . Seizures Mother     Social History Social History  Substance Use Topics  . Smoking status: Current Every Day Smoker  . Smokeless tobacco: Never Used  . Alcohol use Yes     Allergies   Bee venom   Review of Systems Review of Systems  Constitutional: Negative for chills and fever.  Genitourinary:       No urinary or fecal incontinence.  Musculoskeletal: Positive for back pain and myalgias. Negative for arthralgias and gait problem.  Neurological: Negative for dizziness and light-headedness.   Physical Exam Updated Vital Signs BP 94/63 (BP Location: Left Arm)   Pulse 76   Temp 98.4 F (36.9 C) (Oral)   Resp 16   Ht 5\' 7"  (1.702 m)   Wt 95.3 kg (210 lb)   BMI 32.89 kg/m   Physical Exam  Constitutional: He appears well-developed.  HENT:  Head: Normocephalic.  Eyes: Conjunctivae are normal.  Neck: Neck supple.  Cardiovascular: Normal rate, regular rhythm and normal heart sounds.  Exam reveals no gallop and no friction rub.   No murmur  heard. Pulmonary/Chest: Effort normal and breath sounds normal. No respiratory distress. He has no wheezes. He has no rales.  Abdominal: Soft. He exhibits no distension.  Musculoskeletal:  TTP over the bony midline lumbar vertebrae with associated right paraspinal lumbar tenderness No left-sided paraspinal lumbar muscle tenderness No TTP of the cervical or thoracic midline or surrounding paraspinal muscles He has 5/5 strength of bilateral extremities with plantar flexion and dorsiflexion 2+ dp/pt pulses, sensation intact He is TTP over the distribution of the right sciatic nerve Antalgic gait  Neurological: He is alert.  Skin: Skin is warm and dry.  Psychiatric: His behavior is normal.  Nursing note and vitals reviewed.  ED Treatments / Results  COORDINATION OF CARE: 10:49 AM Discussed treatment plan with pt at bedside and pt agreed to plan.  Labs (all labs ordered are listed, but only abnormal results are displayed) Labs Reviewed - No data to display  EKG  EKG Interpretation None       Radiology No results found.  Procedures Procedures (including critical care time)  Medications Ordered in ED Medications  cyclobenzaprine (FLEXERIL) tablet 10 mg (10 mg Oral Given 02/20/17 1049)  ibuprofen (ADVIL,MOTRIN) tablet 800 mg (800 mg Oral Given 02/20/17 1050)     Initial Impression / Assessment and Plan / ED Course  I have reviewed the triage vital signs and the nursing notes.  Pertinent labs & imaging results that were available during my care of the patient were reviewed by me and considered in my medical decision making (see chart for details).     Patient with back pain, mechanical in nature likely secondary to weight lifting.  No neurological deficits and normal neuro exam.  Patient can walk but states it is painful.  No urinary or bowel incontinence.  No concern for cauda equina, infection, AAA, infection, or fracture.  No constitutional symptoms including fever, chills,  or weight loss,  No h/o cancer, IVDU.  Will discharge to home with RICE protocol and anti-inflammatory medicine. Discussed ED return precaution and indications for PCP follow up. The patient acknowledges the plan and is agreeable at this time.   Final Clinical Impressions(s) / ED Diagnoses   Final diagnoses:  Sciatica of right side    New Prescriptions Discharge Medication List as of 02/20/2017 10:57 AM    START taking these medications   Details  cyclobenzaprine (FLEXERIL) 10 MG tablet Take 1 tablet (10 mg total) by mouth 2 (two) times daily as needed for muscle spasms., Starting Tue 02/20/2017, Print      I personally performed the services described in this documentation, which was scribed in my presence. The recorded information has been reviewed and is accurate.     Frederik Pear A, PA-C 02/20/17 1819  Mancel Bale, MD 02/21/17 [phone]

## 2017-02-20 NOTE — ED Triage Notes (Signed)
Pt. Stated, I've had back pain for 2 days and its getting worse.

## 2017-05-16 ENCOUNTER — Emergency Department (HOSPITAL_COMMUNITY)
Admission: EM | Admit: 2017-05-16 | Discharge: 2017-05-16 | Disposition: A | Discharge status: Home / Self Care | Payer: No Typology Code available for payment source | Attending: Emergency Medicine | Admitting: Emergency Medicine

## 2017-05-16 ENCOUNTER — Encounter (HOSPITAL_COMMUNITY): Payer: Self-pay | Admitting: Emergency Medicine

## 2017-05-16 ENCOUNTER — Emergency Department (HOSPITAL_COMMUNITY): Payer: No Typology Code available for payment source

## 2017-05-16 DIAGNOSIS — M25561 Pain in right knee: Secondary | ICD-10-CM | POA: Insufficient documentation

## 2017-05-16 DIAGNOSIS — Z042 Encounter for examination and observation following work accident: Secondary | ICD-10-CM | POA: Insufficient documentation

## 2017-05-16 DIAGNOSIS — M25551 Pain in right hip: Secondary | ICD-10-CM | POA: Insufficient documentation

## 2017-05-16 DIAGNOSIS — G8929 Other chronic pain: Secondary | ICD-10-CM | POA: Insufficient documentation

## 2017-05-16 DIAGNOSIS — M549 Dorsalgia, unspecified: Secondary | ICD-10-CM | POA: Insufficient documentation

## 2017-05-16 DIAGNOSIS — R200 Anesthesia of skin: Secondary | ICD-10-CM | POA: Insufficient documentation

## 2017-05-16 DIAGNOSIS — Z79899 Other long term (current) drug therapy: Secondary | ICD-10-CM | POA: Insufficient documentation

## 2017-05-16 DIAGNOSIS — F17200 Nicotine dependence, unspecified, uncomplicated: Secondary | ICD-10-CM | POA: Insufficient documentation

## 2017-05-16 MED ORDER — CYCLOBENZAPRINE HCL 10 MG PO TABS: 10.0000 mg | ORAL_TABLET | Freq: Two times a day (BID) | ORAL | 0 refills | Status: DC | PRN

## 2017-05-16 MED ORDER — KETOROLAC TROMETHAMINE 60 MG/2ML IM SOLN
60.0000 mg | Freq: Once | INTRAMUSCULAR | Status: AC
  Administered 2017-05-16: 60 mg via INTRAMUSCULAR
  Filled 2017-05-16: qty 2
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
## 2017-05-16 NOTE — Discharge Instructions (Signed)
No hip or knee fracture seen on x-ray today. Her pain is likely musculoskeletal in nature and will take time to improve. Please take 800 mg of ibuprofen every 6 hours as needed for pain. Apply heat to the right hip where you have pain twice a day for the next several days. I have also given you a prescription for a muscle relaxer which should help with spasm in your right hip.   I have written you a work note.  I have given you instructions for several hip exercises. Please complete these as tolerated in the next several days.  Please schedule an appointment with the orthopedic doctor for your ongoing right hip pain. The information is listed in your discharge instructions.
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
## 2017-09-02 ENCOUNTER — Emergency Department (HOSPITAL_COMMUNITY): Payer: Self-pay

## 2017-09-02 ENCOUNTER — Observation Stay (HOSPITAL_COMMUNITY)
Admission: EM | Admit: 2017-09-02 | Discharge: 2017-09-03 | Disposition: A | Discharge status: Home / Self Care | Payer: Self-pay | Attending: General Surgery | Admitting: General Surgery

## 2017-09-02 ENCOUNTER — Encounter (HOSPITAL_COMMUNITY): Payer: Self-pay | Admitting: Emergency Medicine

## 2017-09-02 DIAGNOSIS — F17210 Nicotine dependence, cigarettes, uncomplicated: Secondary | ICD-10-CM | POA: Insufficient documentation

## 2017-09-02 DIAGNOSIS — M542 Cervicalgia: Secondary | ICD-10-CM

## 2017-09-02 DIAGNOSIS — S37031A Laceration of right kidney, unspecified degree, initial encounter: Principal | ICD-10-CM | POA: Insufficient documentation

## 2017-09-02 DIAGNOSIS — S35414A Laceration of right renal vein, initial encounter: Secondary | ICD-10-CM | POA: Diagnosis present

## 2017-09-02 DIAGNOSIS — S161XXA Strain of muscle, fascia and tendon at neck level, initial encounter: Secondary | ICD-10-CM | POA: Insufficient documentation

## 2017-09-02 DIAGNOSIS — S060X0A Concussion without loss of consciousness, initial encounter: Secondary | ICD-10-CM | POA: Insufficient documentation

## 2017-09-02 DIAGNOSIS — R52 Pain, unspecified: Secondary | ICD-10-CM

## 2017-09-02 DIAGNOSIS — M545 Low back pain: Secondary | ICD-10-CM | POA: Insufficient documentation

## 2017-09-02 DIAGNOSIS — R51 Headache: Secondary | ICD-10-CM | POA: Insufficient documentation

## 2017-09-02 LAB — COMPREHENSIVE METABOLIC PANEL
ALK PHOS: 110 U/L (ref 38–126)
ALT: 40 U/L (ref 17–63)
AST: 61 U/L — AB (ref 15–41)
Albumin: 3.9 g/dL (ref 3.5–5.0)
Anion gap: 12 (ref 5–15)
BUN: 6 mg/dL (ref 6–20)
CALCIUM: 8.7 mg/dL — AB (ref 8.9–10.3)
CHLORIDE: 103 mmol/L (ref 101–111)
CO2: 21 mmol/L — AB (ref 22–32)
CREATININE: 1.27 mg/dL — AB (ref 0.61–1.24)
GFR calc non Af Amer: >60 mL/min (ref >60)
GLUCOSE: 106 mg/dL — AB (ref 65–99)
Potassium: 3.8 mmol/L (ref 3.5–5.1)
SODIUM: 136 mmol/L (ref 135–145)
Total Bilirubin: 0.8 mg/dL (ref 0.3–1.2)
Total Protein: 6.6 g/dL (ref 6.5–8.1)

## 2017-09-02 LAB — CBC
HCT: 46 % (ref 39.0–52.0)
Hemoglobin: 16.1 g/dL (ref 13.0–17.0)
MCH: 31.2 pg (ref 26.0–34.0)
MCHC: 35 g/dL (ref 30.0–36.0)
MCV: 89.1 fL (ref 78.0–100.0)
Platelets: 330 10*3/uL (ref 150–400)
RBC: 5.16 MIL/uL (ref 4.22–5.81)
RDW: 13 % (ref 11.5–15.5)
WBC: 10.8 10*3/uL — ABNORMAL HIGH (ref 4.0–10.5)

## 2017-09-02 LAB — ETHANOL

## 2017-09-02 LAB — I-STAT CG4 LACTIC ACID, ED: Lactic Acid, Venous: 1.9 mmol/L (ref 0.5–1.9)

## 2017-09-02 LAB — PROTIME-INR
INR: 0.99
Prothrombin Time: 13 seconds (ref 11.4–15.2)

## 2017-09-02 MED ORDER — SODIUM CHLORIDE 0.9 % IV SOLN
INTRAVENOUS | Status: DC
  Administered 2017-09-02 – 2017-09-03 (×2): via INTRAVENOUS

## 2017-09-02 MED ORDER — DOCUSATE SODIUM 100 MG PO CAPS
100.0000 mg | ORAL_CAPSULE | Freq: Two times a day (BID) | ORAL | Status: DC
  Administered 2017-09-02 – 2017-09-03 (×2): 100 mg via ORAL
  Filled 2017-09-02 (×2): qty 1

## 2017-09-02 MED ORDER — HEPARIN SODIUM (PORCINE) 5000 UNIT/ML IJ SOLN
5000.0000 [IU] | Freq: Three times a day (TID) | INTRAMUSCULAR | Status: DC
  Administered 2017-09-02 – 2017-09-03 (×3): 5000 [IU] via SUBCUTANEOUS
  Filled 2017-09-02 (×3): qty 1

## 2017-09-02 MED ORDER — PANTOPRAZOLE SODIUM 40 MG IV SOLR: 40.0000 mg | Freq: Every day | INTRAVENOUS | Status: DC

## 2017-09-02 MED ORDER — PANTOPRAZOLE SODIUM 40 MG PO TBEC
40.0000 mg | DELAYED_RELEASE_TABLET | Freq: Every day | ORAL | Status: DC
  Administered 2017-09-03: 40 mg via ORAL
  Filled 2017-09-02: qty 1

## 2017-09-02 MED ORDER — ONDANSETRON 4 MG PO TBDP: 4.0000 mg | ORAL_TABLET | Freq: Four times a day (QID) | ORAL | Status: DC | PRN

## 2017-09-02 MED ORDER — HYDROMORPHONE HCL 1 MG/ML IJ SOLN: 0.5000 mg | INTRAMUSCULAR | Status: DC | PRN

## 2017-09-02 MED ORDER — METOPROLOL TARTRATE 5 MG/5ML IV SOLN: 5.0000 mg | Freq: Four times a day (QID) | INTRAVENOUS | Status: DC | PRN

## 2017-09-02 MED ORDER — FENTANYL CITRATE (PF) 100 MCG/2ML IJ SOLN
50.0000 ug | Freq: Once | INTRAMUSCULAR | Status: AC
Start: 2017-09-02 — End: 2017-09-02
  Administered 2017-09-02: 50 ug via INTRAVENOUS

## 2017-09-02 MED ORDER — METHOCARBAMOL 500 MG PO TABS
500.0000 mg | ORAL_TABLET | Freq: Four times a day (QID) | ORAL | Status: DC | PRN
  Administered 2017-09-03: 500 mg via ORAL
  Filled 2017-09-02: qty 1

## 2017-09-02 MED ORDER — ACETAMINOPHEN 325 MG PO TABS: 650.0000 mg | ORAL_TABLET | ORAL | Status: DC | PRN

## 2017-09-02 MED ORDER — HYDRALAZINE HCL 20 MG/ML IJ SOLN: 10.0000 mg | INTRAMUSCULAR | Status: DC | PRN

## 2017-09-02 MED ORDER — ACETAMINOPHEN 325 MG PO TABS
650.0000 mg | ORAL_TABLET | Freq: Four times a day (QID) | ORAL | Status: DC
  Administered 2017-09-02 – 2017-09-03 (×3): 650 mg via ORAL
  Filled 2017-09-02 (×6): qty 2

## 2017-09-02 MED ORDER — OXYCODONE HCL 5 MG PO TABS
5.0000 mg | ORAL_TABLET | ORAL | Status: DC | PRN
  Filled 2017-09-02: qty 1

## 2017-09-02 MED ORDER — ONDANSETRON HCL 4 MG/2ML IJ SOLN: 4.0000 mg | Freq: Four times a day (QID) | INTRAMUSCULAR | Status: DC | PRN

## 2017-09-02 MED ORDER — BISACODYL 10 MG RE SUPP: 10.0000 mg | Freq: Every day | RECTAL | Status: DC | PRN

## 2017-09-02 MED ORDER — MORPHINE SULFATE (PF) 4 MG/ML IV SOLN
4.0000 mg | INTRAVENOUS | Status: DC | PRN
  Administered 2017-09-03 (×2): 4 mg via INTRAVENOUS
  Filled 2017-09-02 (×2): qty 1
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
## 2017-09-02 NOTE — Progress Notes (Signed)
Pt admitted from ED this evening. Very sleepy and unable to provide admission responses, mom at bedside and gave some requested information but does not have responses to all questions
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
## 2017-09-02 NOTE — ED Notes (Addendum)
Spoke with MD Fredricka Bonineonnor to confirm order of Heparin [place]. MD confirms order.

## 2017-09-03 ENCOUNTER — Observation Stay (HOSPITAL_COMMUNITY): Payer: Self-pay

## 2017-09-03 ENCOUNTER — Encounter (HOSPITAL_COMMUNITY): Payer: Self-pay

## 2017-09-03 LAB — COMPREHENSIVE METABOLIC PANEL
ALBUMIN: 3.3 g/dL — AB (ref 3.5–5.0)
ALK PHOS: 94 U/L (ref 38–126)
ALT: 34 U/L (ref 17–63)
AST: 34 U/L (ref 15–41)
Anion gap: 9 (ref 5–15)
BUN: 5 mg/dL — AB (ref 6–20)
CALCIUM: 8.5 mg/dL — AB (ref 8.9–10.3)
CHLORIDE: 105 mmol/L (ref 101–111)
CO2: 24 mmol/L (ref 22–32)
CREATININE: 1.13 mg/dL (ref 0.61–1.24)
GFR calc Af Amer: >60 mL/min (ref >60)
GFR calc non Af Amer: >60 mL/min (ref >60)
GLUCOSE: 105 mg/dL — AB (ref 65–99)
Potassium: 3.9 mmol/L (ref 3.5–5.1)
SODIUM: 138 mmol/L (ref 135–145)
Total Bilirubin: 1.1 mg/dL (ref 0.3–1.2)
Total Protein: 6.3 g/dL — ABNORMAL LOW (ref 6.5–8.1)

## 2017-09-03 LAB — URINALYSIS, ROUTINE W REFLEX MICROSCOPIC
BILIRUBIN URINE: NEGATIVE
Bacteria, UA: NONE SEEN
Glucose, UA: NEGATIVE mg/dL
KETONES UR: NEGATIVE mg/dL
LEUKOCYTES UA: NEGATIVE
NITRITE: NEGATIVE
PROTEIN: 30 mg/dL — AB
Specific Gravity, Urine: 1.035 — ABNORMAL HIGH (ref 1.005–1.030)
pH: 5 (ref 5.0–8.0)

## 2017-09-03 LAB — CBC
HCT: 44.3 % (ref 39.0–52.0)
HEMOGLOBIN: 15.3 g/dL (ref 13.0–17.0)
MCH: 30.5 pg (ref 26.0–34.0)
MCHC: 34.5 g/dL (ref 30.0–36.0)
MCV: 88.2 fL (ref 78.0–100.0)
Platelets: 312 10*3/uL (ref 150–400)
RBC: 5.02 MIL/uL (ref 4.22–5.81)
RDW: 13 % (ref 11.5–15.5)
WBC: 13.3 10*3/uL — AB (ref 4.0–10.5)

## 2017-09-03 LAB — SAMPLE TO BLOOD BANK

## 2017-09-03 LAB — HIV ANTIBODY (ROUTINE TESTING W REFLEX): HIV SCREEN 4TH GENERATION: NONREACTIVE

## 2017-09-03 MED ORDER — METHOCARBAMOL 500 MG PO TABS: 500.0000 mg | ORAL_TABLET | Freq: Four times a day (QID) | ORAL | 0 refills | Status: DC | PRN

## 2017-09-03 NOTE — Progress Notes (Signed)
1900 Discharge instructions given to pt. Waiting for his ride home.
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
## 2017-09-03 NOTE — Discharge Instructions (Signed)
Kidney Laceration  Return to the ED/Call the trauma clinic if...  - You notice blood in your urine - You are unable to void over 8 hours  - Drastic decrease in the volume of urine voided - fevers, chills, lightheadedness, dizziness, pale, increased abdominal pain - Or any other concerning symptoms  Follow up with your primary care provider in 2 weeks for repeat urine analysis  No lifting more than 10 lbs for 2 weeks, can return to work in 2 weeks. No contact sports for 3 months.
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
## 2017-09-04 ENCOUNTER — Encounter (HOSPITAL_COMMUNITY): Payer: Self-pay | Admitting: Emergency Medicine

## 2017-10-02 ENCOUNTER — Emergency Department (HOSPITAL_COMMUNITY): Payer: Self-pay

## 2017-10-02 ENCOUNTER — Emergency Department (HOSPITAL_COMMUNITY)
Admission: EM | Admit: 2017-10-02 | Discharge: 2017-10-02 | Disposition: A | Discharge status: Home / Self Care | Payer: Self-pay | Attending: Emergency Medicine | Admitting: Emergency Medicine

## 2017-10-02 ENCOUNTER — Encounter (HOSPITAL_COMMUNITY): Payer: Self-pay | Admitting: Emergency Medicine

## 2017-10-02 ENCOUNTER — Other Ambulatory Visit: Payer: Self-pay

## 2017-10-02 DIAGNOSIS — N281 Cyst of kidney, acquired: Secondary | ICD-10-CM | POA: Insufficient documentation

## 2017-10-02 DIAGNOSIS — Z041 Encounter for examination and observation following transport accident: Secondary | ICD-10-CM | POA: Insufficient documentation

## 2017-10-02 DIAGNOSIS — Z79899 Other long term (current) drug therapy: Secondary | ICD-10-CM | POA: Insufficient documentation

## 2017-10-02 DIAGNOSIS — M545 Low back pain: Secondary | ICD-10-CM | POA: Insufficient documentation

## 2017-10-02 DIAGNOSIS — M791 Myalgia, unspecified site: Secondary | ICD-10-CM | POA: Insufficient documentation

## 2017-10-02 LAB — CBC
HCT: 47.8 % (ref 39.0–52.0)
HEMOGLOBIN: 16.1 g/dL (ref 13.0–17.0)
MCH: 30.1 pg (ref 26.0–34.0)
MCHC: 33.7 g/dL (ref 30.0–36.0)
MCV: 89.3 fL (ref 78.0–100.0)
Platelets: 313 10*3/uL (ref 150–400)
RBC: 5.35 MIL/uL (ref 4.22–5.81)
RDW: 13.3 % (ref 11.5–15.5)
WBC: 10.6 10*3/uL — ABNORMAL HIGH (ref 4.0–10.5)

## 2017-10-02 LAB — I-STAT CHEM 8, ED
BUN: 11 mg/dL (ref 6–20)
CREATININE: 1.1 mg/dL (ref 0.61–1.24)
Calcium, Ion: 1.23 mmol/L (ref 1.15–1.40)
Chloride: 102 mmol/L (ref 101–111)
Glucose, Bld: 93 mg/dL (ref 65–99)
HCT: 49 % (ref 39.0–52.0)
HEMOGLOBIN: 16.7 g/dL (ref 13.0–17.0)
Potassium: 4.3 mmol/L (ref 3.5–5.1)
Sodium: 141 mmol/L (ref 135–145)
TCO2: 31 mmol/L (ref 22–32)

## 2017-10-02 LAB — BASIC METABOLIC PANEL
Anion gap: 11 (ref 5–15)
BUN: 9 mg/dL (ref 6–20)
CALCIUM: 9.4 mg/dL (ref 8.9–10.3)
CO2: 26 mmol/L (ref 22–32)
CREATININE: 1.04 mg/dL (ref 0.61–1.24)
Chloride: 103 mmol/L (ref 101–111)
GFR calc non Af Amer: >60 mL/min (ref >60)
Glucose, Bld: 98 mg/dL (ref 65–99)
Potassium: 4.3 mmol/L (ref 3.5–5.1)
SODIUM: 140 mmol/L (ref 135–145)

## 2017-10-02 MED ORDER — KETOROLAC TROMETHAMINE 15 MG/ML IJ SOLN
15.0000 mg | Freq: Once | INTRAMUSCULAR | Status: AC
  Administered 2017-10-02: 15 mg via INTRAVENOUS
  Filled 2017-10-02: qty 1

## 2017-10-02 MED ORDER — IOPAMIDOL (ISOVUE-300) INJECTION 61%
INTRAVENOUS | Status: AC
  Administered 2017-10-02: 100 mL
  Filled 2017-10-02: qty 100

## 2017-10-02 MED ORDER — PREDNISONE 50 MG PO TABS: 50.0000 mg | ORAL_TABLET | Freq: Every day | ORAL | 0 refills | Status: AC

## 2017-10-02 MED ORDER — TRAMADOL HCL 50 MG PO TABS: 50.0000 mg | ORAL_TABLET | Freq: Two times a day (BID) | ORAL | 0 refills | Status: DC | PRN

## 2017-10-02 MED ORDER — CYCLOBENZAPRINE HCL 5 MG PO TABS: 5.0000 mg | ORAL_TABLET | Freq: Two times a day (BID) | ORAL | 0 refills | Status: DC | PRN

## 2017-10-02 MED ORDER — KETOROLAC TROMETHAMINE 15 MG/ML IJ SOLN: 15.0000 mg | Freq: Once | INTRAMUSCULAR | Status: DC

## 2017-10-02 NOTE — Discharge Instructions (Signed)
Take prednisone as prescribed for 5 days. Do not take antiinflammatories at the same time (advil, motrin, naproxen, ibuprofen, aleve). You may supplement with Tylenol if you need further pain control. Use tramadol as needed for severe pain. Use Flexeril as needed for muscle stiffness or soreness.  Have caution, this may make you tired or groggy.  Do not drive or operate heavy machinery while taking this medicine. Use ice packs or heating pads if this helps control your pain. Follow-up with primary care  if your symptoms are not improving. You may call the 1-866 number in the paperwork or contact [facility].  Return to the emergency room if you develop vision changes, vomiting, slurred speech, numbness, loss of bowel or bladder control, or any new or worsening symptoms.
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
## 2018-05-07 ENCOUNTER — Encounter (HOSPITAL_COMMUNITY): Payer: Self-pay | Admitting: Emergency Medicine

## 2018-05-07 ENCOUNTER — Other Ambulatory Visit: Payer: Self-pay

## 2018-05-07 ENCOUNTER — Ambulatory Visit (HOSPITAL_COMMUNITY)
Admission: EM | Admit: 2018-05-07 | Discharge: 2018-05-07 | Disposition: A | Discharge status: Home / Self Care | Payer: Self-pay | Attending: Family Medicine | Admitting: Family Medicine

## 2018-05-07 DIAGNOSIS — B349 Viral infection, unspecified: Secondary | ICD-10-CM

## 2018-05-07 DIAGNOSIS — R0789 Other chest pain: Secondary | ICD-10-CM

## 2018-05-07 MED ORDER — METHOCARBAMOL 500 MG PO TABS: 500.0000 mg | ORAL_TABLET | Freq: Two times a day (BID) | ORAL | 0 refills | Status: DC

## 2018-05-07 MED ORDER — PREDNISONE 50 MG PO TABS: 50.0000 mg | ORAL_TABLET | Freq: Every day | ORAL | 0 refills | Status: DC

## 2018-05-07 MED ORDER — KETOROLAC TROMETHAMINE 30 MG/ML IJ SOLN
30.0000 mg | Freq: Once | INTRAMUSCULAR | Status: AC
  Administered 2018-05-07: 30 mg via INTRAMUSCULAR

## 2018-05-07 MED ORDER — KETOROLAC TROMETHAMINE 30 MG/ML IJ SOLN
INTRAMUSCULAR | Status: AC
  Filled 2018-05-07: qty 1

## 2018-05-07 NOTE — ED Provider Notes (Signed)
[facility]    CSN: [id number] Arrival date & time: 05/07/18  1120     History   Chief Complaint Chief Complaint  Patient presents with  . Chest Pain    HPI Stephen Ward is a 37 y.o. male.   37 year old male comes in for 2-day history of chest pain.  He was at work yesterday, and started feeling the pain across his left chest while doing heavy lifting.  Pain constant since then, with painful palpation.  States feels hard to take a deep breath due to the pain.  He has had 2-day history of nasal congestion, rhinorrhea, cough, cough making the chest pain worse.  He has had increased activity in the past week, and has not had chest pain during that time.  Denies fever, chills, night sweats.  Denies nausea, vomiting, abdominal pain.  Denies weakness, dizziness, syncope.  Denies family or personal history of heart disease.  Denies exertional chest pain, dyspnea on exertion.  Ibuprofen 200mg  without relief.      Past Medical History:  Diagnosis Date  . Back pain   . Headache     Patient Active Problem List   Diagnosis Date Noted  . Renal vein laceration, right, initial encounter 09/02/2017    History reviewed. No pertinent surgical history.     Home Medications    Prior to Admission medications   Medication Sig Start Date End Date Taking? Authorizing Provider  ibuprofen (ADVIL,MOTRIN) 200 MG tablet Take 400 mg by mouth at bedtime as needed (pain).   Yes [provider]  methocarbamol (ROBAXIN) 500 MG tablet Take 1 tablet (500 mg total) by mouth 2 (two) times daily. 05/07/18   Cathie HoopsYu, Amy V, PA-C  predniSONE (DELTASONE) 50 MG tablet Take 1 tablet (50 mg total) by mouth daily. 05/07/18   Belinda FisherYu, Amy V, PA-C    Family History Family History  Problem Relation Age of Onset  . Seizures Mother     Social History Social History   Tobacco Use  . Smoking status: Never Smoker  . Smokeless tobacco: Never Used  Substance Use Topics  . Alcohol use: No   Frequency: Never  . Drug use: No     Allergies   Bee venom and Bee venom   Review of Systems Review of Systems  Reason unable to perform ROS: See HPI as above.     Physical Exam Triage Vital Signs ED Triage Vitals [05/07/18 1220]  Enc Vitals Group     BP 116/68     Pulse Rate 100     Resp 18     Temp 98.8 F (37.1 C)     Temp Source Oral     SpO2 98 %     Weight      Height      Head Circumference      Peak Flow      Pain Score 9     Pain Loc      Pain Edu?      Excl. in GC?    No data found.  Updated Vital Signs BP 116/68 (BP Location: Left Arm)   Pulse 100   Temp 98.8 F (37.1 C) (Oral)   Resp 18   SpO2 98%   Physical Exam  Constitutional: He is oriented to person, place, and time. He appears well-developed and well-nourished.  Non-toxic appearance. He does not appear ill. No distress.  HENT:  Head: Normocephalic and atraumatic.  Right Ear: Tympanic membrane, external ear and ear  canal normal. Tympanic membrane is not erythematous and not bulging.  Left Ear: Tympanic membrane, external ear and ear canal normal. Tympanic membrane is not erythematous and not bulging.  Nose: Nose normal. Right sinus exhibits no maxillary sinus tenderness and no frontal sinus tenderness. Left sinus exhibits no maxillary sinus tenderness and no frontal sinus tenderness.  Mouth/Throat: Uvula is midline, oropharynx is clear and moist and mucous membranes are normal.  Eyes: Pupils are equal, round, and reactive to light. Conjunctivae are normal.  Neck: Normal range of motion. Neck supple.  Cardiovascular: Normal rate, regular rhythm and normal heart sounds. Exam reveals no gallop and no friction rub.  No murmur heard. Pulmonary/Chest: Effort normal and breath sounds normal. No accessory muscle usage or stridor. No tachypnea. No respiratory distress. He has no decreased breath sounds. He has no wheezes. He has no rhonchi. He has no rales.  Diffuse tenderness to palpation of the  sternum and left chest  Musculoskeletal:  Tenderness to palpation of left upper back without bony tenderness.  Full range of motion of shoulder.  Strength normal and equal bilaterally.  Sensation intact and equal bilaterally.  Radial pulse 2+, cap refill less than 2 seconds.  Lymphadenopathy:    He has no cervical adenopathy.  Neurological: He is alert and oriented to person, place, and time.  Skin: Skin is warm and dry.  Psychiatric: He has a normal mood and affect. His behavior is normal. Judgment normal.     UC Treatments / Results  Labs (all labs ordered are listed, but only abnormal results are displayed) Labs Reviewed - No data to display  EKG None  Radiology No results found.  Procedures Procedures (including critical care time)  Medications Ordered in UC Medications  ketorolac (TORADOL) 30 MG/ML injection 30 mg (30 mg Intramuscular Given 05/07/18 1317)    Initial Impression / Assessment and Plan / UC Course  I have reviewed the triage vital signs and the nursing notes.  Pertinent labs & imaging results that were available during my care of the patient were reviewed by me and considered in my medical decision making (see chart for details).    Reproducible chest pain.  Patient without history of heart disease, without exertional chest pain, shortness of breath.  EKG sinus tachycardia, 101 bpm, unchanged from prior.  Toradol injection in office today.  Prednisone as directed, muscle relaxant as needed.  Return precautions given.  Patient expresses understanding and agrees to plan.  Final Clinical Impressions(s) / UC Diagnoses   Final diagnoses:  Chest wall pain  Viral illness    ED Prescriptions    Medication Sig Dispense Auth. Provider   predniSONE (DELTASONE) 50 MG tablet Take 1 tablet (50 mg total) by mouth daily. 5 tablet Yu, Amy V, PA-C   methocarbamol (ROBAXIN) 500 MG tablet Take 1 tablet (500 mg total) by mouth 2 (two) times daily. 20 tablet Threasa Alpha, [place] 05/07/18 1730

## 2018-05-07 NOTE — Discharge Instructions (Signed)
Toradol injection in office today.  Prednisone as directed, this will help with your cough as well as decreasing inflammation for your muscles.  Robaxin as needed, this can make you drowsy, so do not take if you are going to drive, operate heavy machinery, or make important decisions. Ice/heat compresses as needed. This can take up to 3-4 weeks to completely resolve, but you should be feeling better each week. If experiencing worsening symptoms, worsening chest pain, shortness of breath, weakness, dizziness, passing out, go to the emergency department for further evaluation needed.  You can continue dayquil/nyquil for cold symptoms. The prednisone will help with symptoms as well. Keep hydrated, your urine should be clear to pale yellow in color.

## 2018-05-07 NOTE — ED Triage Notes (Signed)
[facility] a complaint of left sided chest pain that started yesterday after lifting a bread rack. The patient reported that the pain increases with movement.

## 2019-08-26 ENCOUNTER — Ambulatory Visit (HOSPITAL_COMMUNITY)
Admission: EM | Admit: 2019-08-26 | Discharge: 2019-08-26 | Disposition: A | Discharge status: Home / Self Care | Payer: HRSA Program | Attending: Family Medicine | Admitting: Family Medicine

## 2019-08-26 ENCOUNTER — Encounter (HOSPITAL_COMMUNITY): Payer: Self-pay

## 2019-08-26 ENCOUNTER — Other Ambulatory Visit: Payer: Self-pay

## 2019-08-26 DIAGNOSIS — R05 Cough: Secondary | ICD-10-CM | POA: Diagnosis not present

## 2019-08-26 DIAGNOSIS — U071 COVID-19: Secondary | ICD-10-CM | POA: Diagnosis not present

## 2019-08-26 DIAGNOSIS — Z20822 Contact with and (suspected) exposure to covid-19: Secondary | ICD-10-CM | POA: Diagnosis present

## 2019-08-26 NOTE — ED Triage Notes (Signed)
Pt states girlfriend +COVID approx 1 week ago with symptoms.  Pt c/o intermittent HA, cough, nasal congestion, back pain/left flank pain. Denies n/v/d, fever, chills, SOB. Denies dysuria symptoms or abd pain.

## 2019-08-26 NOTE — ED Provider Notes (Signed)
[facility]    CSN: [id number] Arrival date & time: 08/26/19  1118      History   Chief Complaint Chief Complaint  Patient presents with  . Cough    HPI Stephen Ward is a 39 y.o. male.   Patient is a 39 year old male the presents today with intermittent headache, cough, nasal congestion, body aches for the past couple days.  Reporting his girlfriend tested positive for Covid approximate 1 week ago but has since recovered from her illness.  He has been taking Mucinex, Tylenol and ibuprofen with relief of his symptoms.  Denies any chest pain or shortness of breath.  Denies any fever, chills, night sweats, loss of taste or smell, diarrhea.  ROS per HPI      Past Medical History:  Diagnosis Date  . Back pain   . Headache     Patient Active Problem List   Diagnosis Date Noted  . Renal vein laceration, right, initial encounter 09/02/2017    History reviewed. No pertinent surgical history.     Home Medications    Prior to Admission medications   Not on File    Family History Family History  Problem Relation Age of Onset  . Seizures Mother   . Healthy Father     Social History Social History   Tobacco Use  . Smoking status: Current Every Day Smoker    Packs/day: 0.50    Types: Cigarettes  . Smokeless tobacco: Never Used  Substance Use Topics  . Alcohol use: No  . Drug use: No     Allergies   Bee venom and Bee venom   Review of Systems Review of Systems   Physical Exam Triage Vital Signs ED Triage Vitals  Enc Vitals Group     BP 08/26/19 1339 115/68     Pulse Rate 08/26/19 1339 70     Resp 08/26/19 1339 18     Temp 08/26/19 1339 98.4 F (36.9 C)     Temp Source 08/26/19 1339 Oral     SpO2 08/26/19 1339 100 %     Weight --      Height --      Head Circumference --      Peak Flow --      Pain Score 08/26/19 1336 0     Pain Loc --      Pain Edu? --      Excl. in GC? --    No data found.  Updated Vital Signs BP  115/68 (BP Location: Left Arm)   Pulse 70   Temp 98.4 F (36.9 C) (Oral)   Resp 18   SpO2 100%   Visual Acuity Right Eye Distance:   Left Eye Distance:   Bilateral Distance:    Right Eye Near:   Left Eye Near:    Bilateral Near:     Physical Exam Vitals and nursing note reviewed.  Constitutional:      Appearance: Normal appearance.  HENT:     Head: Normocephalic and atraumatic.     Nose: Nose normal.  Eyes:     Conjunctiva/sclera: Conjunctivae normal.  Pulmonary:     Effort: Pulmonary effort is normal.  Musculoskeletal:        General: Normal range of motion.     Cervical back: Normal range of motion.  Skin:    General: Skin is warm and dry.  Neurological:     Mental Status: He is alert.  Psychiatric:  Mood and Affect: Mood normal.      UC Treatments / Results  Labs (all labs ordered are listed, but only abnormal results are displayed) Labs Reviewed  NOVEL CORONAVIRUS, NAA (HOSP ORDER, SEND-OUT TO REF LAB; TAT 18-24 HRS)    EKG   Radiology No results found.  Procedures Procedures (including critical care time)  Medications Ordered in UC Medications - No data to display  Initial Impression / Assessment and Plan / UC Course  I have reviewed the triage vital signs and the nursing notes.  Pertinent labs & imaging results that were available during my care of the patient were reviewed by me and considered in my medical decision making (see chart for details).     Classes posterior to Covid with symptoms-swab sent for testing with labs pending Quarantine precautions given.  He can continue the over-the-counter medication as needed for symptoms. Final Clinical Impressions(s) / UC Diagnoses   Final diagnoses:  Close exposure to COVID-19 virus     Discharge Instructions     We have tested you for COVID  Go home and quarantine until we get our results.  Work note given You can take OTC medications as needed.      ED Prescriptions     None     PDMP not reviewed this encounter.   Orvan July, NP 08/26/19 1413

## 2019-08-26 NOTE — Discharge Instructions (Addendum)
We have tested you for COVID  °Go home and quarantine until we get our results.  °Work note given °You can take OTC medications as needed.  ° °

## 2019-08-27 LAB — NOVEL CORONAVIRUS, NAA (HOSP ORDER, SEND-OUT TO REF LAB; TAT 18-24 HRS): SARS-CoV-2, NAA: DETECTED — AB

## 2019-08-29 ENCOUNTER — Telehealth: Payer: Self-pay | Admitting: Emergency Medicine

## 2019-08-29 NOTE — Telephone Encounter (Signed)
Patient contacted by phone and made aware of   pos covid results. Pt verbalized understanding and had all questions answered.    

## 2019-08-29 NOTE — Telephone Encounter (Signed)
Your test for COVID-19 was positive, meaning that you were infected with the novel coronavirus and could give the germ to others.  Please continue isolation at home for at least 10 days since the start of your symptoms. If you do not have symptoms, please isolate at home for 10 days from the day you were tested. Once you complete your 10 day quarantine, you may return to normal activities as long as you've not had a fever for over 24 hours(without taking fever reducing medicine) and your symptoms are improving. Please continue good preventive care measures, including:  frequent hand-washing, avoid touching your face, cover coughs/sneezes, stay out of crowds and keep a 6 foot distance from others.  Go to the nearest hospital emergency room if fever/cough/breathlessness are severe or illness seems like a threat to life.  Attempted to reach patient. No working number on file  If you have any questions, you may call me at [phone]

## 2020-03-29 ENCOUNTER — Encounter (HOSPITAL_COMMUNITY): Payer: Self-pay | Admitting: Emergency Medicine

## 2020-03-29 ENCOUNTER — Emergency Department (HOSPITAL_COMMUNITY)
Admission: EM | Admit: 2020-03-29 | Discharge: 2020-03-29 | Disposition: A | Discharge status: Home / Self Care | Payer: No Typology Code available for payment source | Attending: Emergency Medicine | Admitting: Emergency Medicine

## 2020-03-29 ENCOUNTER — Emergency Department (HOSPITAL_COMMUNITY): Payer: No Typology Code available for payment source

## 2020-03-29 ENCOUNTER — Other Ambulatory Visit: Payer: Self-pay

## 2020-03-29 DIAGNOSIS — B353 Tinea pedis: Secondary | ICD-10-CM | POA: Insufficient documentation

## 2020-03-29 DIAGNOSIS — F17210 Nicotine dependence, cigarettes, uncomplicated: Secondary | ICD-10-CM | POA: Insufficient documentation

## 2020-03-29 DIAGNOSIS — R21 Rash and other nonspecific skin eruption: Secondary | ICD-10-CM | POA: Insufficient documentation

## 2020-03-29 DIAGNOSIS — L0390 Cellulitis, unspecified: Secondary | ICD-10-CM | POA: Insufficient documentation

## 2020-03-29 MED ORDER — CLOTRIMAZOLE 1 % EX CREA: TOPICAL_CREAM | CUTANEOUS | 0 refills | Status: DC

## 2020-03-29 MED ORDER — SULFAMETHOXAZOLE-TRIMETHOPRIM 800-160 MG PO TABS: 1.0000 | ORAL_TABLET | Freq: Two times a day (BID) | ORAL | 0 refills | Status: AC

## 2020-03-29 MED ORDER — NAPROXEN 500 MG PO TABS: 500.0000 mg | ORAL_TABLET | Freq: Two times a day (BID) | ORAL | 0 refills | Status: DC

## 2020-03-29 NOTE — ED Provider Notes (Signed)
[facility] EMERGENCY DEPARTMENT Provider Note   CSN: [id number] Arrival date & time: 03/29/20  1116     History Chief Complaint  Patient presents with  . Foot Pain    Stephen Ward is a 39 y.o. male.  Patient with a normal general medical history presenting to the emergency department for right foot pain.  Patient reports that he woke up on Saturday with foot pain.  Denies any injury or trauma or prolonged walking or standing.  Denies any issues.  Reports that he was having some drainage between his fourth and fifth toes with a lot of itching.  Also reports swelling to his toes.  Has not tried anything for relief.  Denies any fever.        Past Medical History:  Diagnosis Date  . Back pain   . Headache     Patient Active Problem List   Diagnosis Date Noted  . Renal vein laceration, right, initial encounter 09/02/2017    History reviewed. No pertinent surgical history.     Family History  Problem Relation Age of Onset  . Seizures Mother   . Healthy Father     Social History   Tobacco Use  . Smoking status: Current Every Day Smoker    Packs/day: 0.50    Types: Cigarettes  . Smokeless tobacco: Never Used  Vaping Use  . Vaping Use: Never used  Substance Use Topics  . Alcohol use: No  . Drug use: No    Home Medications Prior to Admission medications   Medication Sig Start Date End Date Taking? Authorizing Provider  clotrimazole (LOTRIMIN) 1 % cream Apply to right toes 2 times daily 03/29/20   Ronnie Doss A, PA-C  naproxen (NAPROSYN) 500 MG tablet Take 1 tablet (500 mg total) by mouth 2 (two) times daily. 03/29/20   Ronnie Doss A, PA-C  sulfamethoxazole-trimethoprim (BACTRIM DS) 800-160 MG tablet Take 1 tablet by mouth 2 (two) times daily for 7 days. 03/29/20 04/05/20  Arlyn Dunning, PA-C    Allergies    Bee venom and Bee venom  Review of Systems   Review of Systems  Constitutional: Negative for chills and fever.  Gastrointestinal:  Negative.   Musculoskeletal: Positive for arthralgias and gait problem. Negative for back pain, myalgias, neck pain and neck stiffness.  Skin: Positive for rash.  Allergic/Immunologic: Negative for immunocompromised state.  Hematological: Does not bruise/bleed easily.  All other systems reviewed and are negative.   Physical Exam Updated Vital Signs BP 103/76 (BP Location: Left Arm)   Pulse 81   Temp 98 F (36.7 C) (Oral)   Resp 17   Ht 5\' 7"  (1.702 m)   Wt 92.1 kg   SpO2 98%   BMI 31.79 kg/m   Physical Exam Vitals and nursing note reviewed.  Constitutional:      General: He is not in acute distress.    Appearance: Normal appearance. He is not ill-appearing, toxic-appearing or diaphoretic.  HENT:     Head: Normocephalic.  Eyes:     Conjunctiva/sclera: Conjunctivae normal.  Cardiovascular:     Rate and Rhythm: Normal rate and regular rhythm.  Pulmonary:     Effort: Pulmonary effort is normal.  Abdominal:     General: Abdomen is flat.  Musculoskeletal:     Comments: Strength, sensation, range of motion and pulses of the bilateral lower extremities  Skin:    General: Skin is warm and dry.     Findings: Erythema present.  Comments: Patient is wearing sneakers without socks. Right foot with Tinea appearing rash between all toes, worse in between 4th and 5th digit with erythema, swelling and purulent drainage. Swelling and tenderness extend to the lateral foot.   Neurological:     Mental Status: He is alert.  Psychiatric:        Mood and Affect: Mood normal.     ED Results / Procedures / Treatments   Labs (all labs ordered are listed, but only abnormal results are displayed) Labs Reviewed - No data to display  EKG None  Radiology DG Foot Complete Right  Result Date: 03/29/2020 CLINICAL DATA:  Pain EXAM: RIGHT FOOT COMPLETE - 3+ VIEW COMPARISON:  None. FINDINGS: Frontal, oblique, and lateral views were obtained. No fracture or dislocation. Joint spaces appear  normal. No erosive change. IMPRESSION: No fracture or dislocation.  No appreciable arthropathy. Electronically Signed   By: Bretta Bang III M.D.   On: 03/29/2020 12:47    Procedures Procedures (including critical care time)  Medications Ordered in ED Medications - No data to display  ED Course  I have reviewed the triage vital signs and the nursing notes.  Pertinent labs & imaging results that were available during my care of the patient were reviewed by me and considered in my medical decision making (see chart for details).  Clinical Course as of Mar 29 1332  [facility] Mar 29, 2020  1331 Patient with atraumatic right hip pain since Saturday.  On exam he has a tinea appearing rash, worse with.  There does appear to be secondary bacterial infection.  Pain and swelling.  No abscess.  X-ray is normal.  Will start antibiotics as well as antifungal medications.  Advised on return precautions.   [KM]    Clinical Course User Index [KM] Jeral Pinch   MDM Rules/Calculators/A&P                          Based on review of vitals, medical screening exam, lab work and/or imaging, there does not appear to be an acute, emergent etiology for the patient's symptoms. Counseled pt on good return precautions and encouraged both PCP and ED follow-up as needed.  Prior to discharge, I also discussed incidental imaging findings with patient in detail and advised appropriate, recommended follow-up in detail.  Clinical Impression: 1. Tinea pedis of right foot   2. Cellulitis, unspecified cellulitis site     Disposition: Discharge  This note was prepared with assistance of Dragon voice recognition software. Occasional wrong-word or sound-a-like substitutions may have occurred due to the inherent limitations of voice recognition software.  Final Clinical Impression(s) / ED Diagnoses Final diagnoses:  Tinea pedis of right foot  Cellulitis, unspecified cellulitis site    Rx / DC Orders ED  Discharge Orders         Ordered    sulfamethoxazole-trimethoprim (BACTRIM DS) 800-160 MG tablet  2 times daily     Discontinue  Reprint     03/29/20 1333    clotrimazole (LOTRIMIN) 1 % cream     Discontinue  Reprint     03/29/20 1333    naproxen (NAPROSYN) 500 MG tablet  2 times daily     Discontinue  Reprint     03/29/20 1333           Jeral Pinch 03/29/20 1334    Pricilla Loveless, MD 03/29/20 1610

## 2020-03-29 NOTE — ED Notes (Signed)
Pt verbalized understanding of discharge instructions. Pain meds, prescriptions, and foot care reviewed, pt had no further questions.

## 2020-03-29 NOTE — Discharge Instructions (Addendum)
It appears you have a fungal and bacterial infection of your foot. Please keep your foot clean and dry and always wear socks with shoes.

## 2020-03-29 NOTE — ED Triage Notes (Signed)
Right foot pain for the past few days, no injury.

## 2020-04-06 ENCOUNTER — Ambulatory Visit (HOSPITAL_COMMUNITY)
Admission: EM | Admit: 2020-04-06 | Discharge: 2020-04-06 | Disposition: A | Discharge status: Home / Self Care | Payer: Self-pay | Attending: Family Medicine | Admitting: Family Medicine

## 2020-04-06 ENCOUNTER — Other Ambulatory Visit: Payer: Self-pay

## 2020-04-06 DIAGNOSIS — N481 Balanitis: Secondary | ICD-10-CM

## 2020-04-06 DIAGNOSIS — T50905A Adverse effect of unspecified drugs, medicaments and biological substances, initial encounter: Secondary | ICD-10-CM

## 2020-04-06 DIAGNOSIS — M79671 Pain in right foot: Secondary | ICD-10-CM

## 2020-04-06 LAB — POCT URINALYSIS DIPSTICK, ED / UC
Bilirubin Urine: NEGATIVE
Glucose, UA: NEGATIVE mg/dL
Ketones, ur: NEGATIVE mg/dL
Leukocytes,Ua: NEGATIVE
Nitrite: NEGATIVE
Protein, ur: NEGATIVE mg/dL
Specific Gravity, Urine: 1.015 (ref 1.005–1.030)
Urobilinogen, UA: 0.2 mg/dL (ref 0.0–1.0)
pH: 5.5 (ref 5.0–8.0)

## 2020-04-06 MED ORDER — ONDANSETRON 4 MG PO TBDP
ORAL_TABLET | ORAL | Status: AC
  Filled 2020-04-06: qty 1

## 2020-04-06 MED ORDER — CEPHALEXIN 500 MG PO CAPS: 500.0000 mg | ORAL_CAPSULE | Freq: Four times a day (QID) | ORAL | 0 refills | Status: AC

## 2020-04-06 NOTE — ED Provider Notes (Signed)
[facility]    CSN: [id number] Arrival date & time: 04/06/20  6789      History   Chief Complaint Chief Complaint  Patient presents with  . Foot Pain    HPI Stephen Ward is a 39 y.o. male.   Patient presents for possible reaction to Bactrim.  He reports he started Bactrim on 03/29/2020 due to a suspected secondary bacterial infection on a fungal infection on his foot.  He reports the next day he developed pain around the skin of his penis.  Reports lots of irritation and pain on the outside of the penis since then.  There have been no blisters.  Irritation is primarily in the right side.  He reports over the last few days he had noticed a little bit of skin drainage on the right side.  Denies painful urination or urethral discharge.  Denies testicular pain.  No fevers or chills.  He stopped the Bactrim immediately when feeling this.  He reports his foot has any improved with just the cream he was given.  No history of herpes.  He is sexually active with one partner and not concerned about STDs.  She has been using Neosporin for the last 24 to 48 hours on the penis and this has drastically improved pain and irritation.     Past Medical History:  Diagnosis Date  . Back pain   . Headache     Patient Active Problem List   Diagnosis Date Noted  . Renal vein laceration, right, initial encounter 09/02/2017    No past surgical history on file.     Home Medications    Prior to Admission medications   Medication Sig Start Date End Date Taking? Authorizing Provider  cephALEXin (KEFLEX) 500 MG capsule Take 1 capsule (500 mg total) by mouth 4 (four) times daily for 7 days. 04/06/20 04/13/20  Marcelle Bebout, Veryl Speak, PA-C  clotrimazole (LOTRIMIN) 1 % cream Apply to right toes 2 times daily 03/29/20   Ronnie Doss A, PA-C  naproxen (NAPROSYN) 500 MG tablet Take 1 tablet (500 mg total) by mouth 2 (two) times daily. 03/29/20   Arlyn Dunning, PA-C    Family History Family History    Problem Relation Age of Onset  . Seizures Mother   . Healthy Father     Social History Social History   Tobacco Use  . Smoking status: Current Every Day Smoker    Packs/day: 0.50    Types: Cigarettes  . Smokeless tobacco: Never Used  Vaping Use  . Vaping Use: Never used  Substance Use Topics  . Alcohol use: No  . Drug use: No     Allergies   Bee venom and Bee venom   Review of Systems Review of Systems   Physical Exam Triage Vital Signs ED Triage Vitals  Enc Vitals Group     BP      Pulse      Resp      Temp      Temp src      SpO2      Weight      Height      Head Circumference      Peak Flow      Pain Score      Pain Loc      Pain Edu?      Excl. in GC?    No data found.  Updated Vital Signs BP 107/68 (BP Location: Left Arm)   Pulse 76  Temp 98.4 F (36.9 C) (Oral)   Resp 18   SpO2 96%   Visual Acuity Right Eye Distance:   Left Eye Distance:   Bilateral Distance:    Right Eye Near:   Left Eye Near:    Bilateral Near:     Physical Exam Vitals and nursing note reviewed.  Constitutional:      Appearance: He is well-developed.  HENT:     Head: Normocephalic and atraumatic.  Eyes:     Conjunctiva/sclera: Conjunctivae normal.  Cardiovascular:     Rate and Rhythm: Normal rate and regular rhythm.     Heart sounds: No murmur heard.   Pulmonary:     Effort: Pulmonary effort is normal. No respiratory distress.     Breath sounds: Normal breath sounds.  Abdominal:     Palpations: Abdomen is soft.     Tenderness: There is no abdominal tenderness.  Genitourinary:    Penis: Circumcised.        Comments: Right side of the glans penis with erythema and tenderness to palpation.  No active discharge in the glans.  However on the skin just proximal to the right glans there is continued erythema with slight scant discharge.  No vesicles.  There is no discharge at the urethral meatus.  Left aspects of the penis and shaft are normal.  No  testicular pain or tenderness or swelling. Musculoskeletal:     Cervical back: Neck supple.  Skin:    General: Skin is warm and dry.     Comments: Right foot with tinea-like crusting between digits, no swelling or erythema or tenderness on the right foot.  Neurological:     Mental Status: He is alert.      UC Treatments / Results  Labs (all labs ordered are listed, but only abnormal results are displayed) Labs Reviewed  POCT URINALYSIS DIPSTICK, ED / UC - Abnormal; Notable for the following components:      Result Value   Hgb urine dipstick SMALL (*)    All other components within normal limits    EKG   Radiology No results found.  Procedures Procedures (including critical care time)  Medications Ordered in UC Medications - No data to display  Initial Impression / Assessment and Plan / UC Course  I have reviewed the triage vital signs and the nursing notes.  Pertinent labs & imaging results that were available during my care of the patient were reviewed by me and considered in my medical decision making (see chart for details).     #Balanitis #Adverse reaction to drug Patient is a 39 year old presenting with possible localized drug eruption to Bactrim now with a secondary balanitis.  Given no dysuria or discharge, likely this is all external.  Given he had significant response to Neosporin will recommend continued use of this.  Will place on Keflex for possible streptococcal balanitis.  Right foot seems to have significantly improved since ED visit,, doubt any continued bacterial infection here.  Patient has remained off Bactrim.  Discussed return and follow-up precautions.  Primary care resource given is patient does not have primary care.  Highly encouraged him to establish.  Patient verbalized understand plan of care. Final Clinical Impressions(s) / UC Diagnoses   Final diagnoses:  Adverse effect of drug, initial encounter  Balanitis     Discharge Instructions       Stop the bactrim  Continue topical neosporyn Take the keflex as prescribed  Gentle cleaning daily  If not improving in the next  2-3 days return or follow up with primary care      ED Prescriptions    Medication Sig Dispense Auth. Provider   cephALEXin (KEFLEX) 500 MG capsule Take 1 capsule (500 mg total) by mouth 4 (four) times daily for 7 days. 28 capsule Enora Trillo, Veryl Speak, PA-C     PDMP not reviewed this encounter.   Hermelinda Medicus, PA-C 04/06/20 1216

## 2020-04-06 NOTE — Discharge Instructions (Signed)
Stop the bactrim  Continue topical neosporyn Take the keflex as prescribed  Gentle cleaning daily  If not improving in the next 2-3 days return or follow up with primary care

## 2020-04-06 NOTE — ED Triage Notes (Signed)
Pt presents with toe pain on right foot. Was seen in ED on 03/29/20 for fungal infection. States has been using cream. Unable to take antibiotic given due to side effects. States medication is causing a burning sensation in his penis, hurts to touch and dysuria.

## 2020-09-21 ENCOUNTER — Ambulatory Visit (HOSPITAL_COMMUNITY)
Admission: EM | Admit: 2020-09-21 | Discharge: 2020-09-21 | Disposition: A | Discharge status: Home / Self Care | Payer: HRSA Program | Attending: Emergency Medicine | Admitting: Emergency Medicine

## 2020-09-21 ENCOUNTER — Encounter (HOSPITAL_COMMUNITY): Payer: Self-pay | Admitting: *Deleted

## 2020-09-21 ENCOUNTER — Other Ambulatory Visit: Payer: Self-pay

## 2020-09-21 DIAGNOSIS — Z20822 Contact with and (suspected) exposure to covid-19: Secondary | ICD-10-CM | POA: Diagnosis present

## 2020-09-21 DIAGNOSIS — B349 Viral infection, unspecified: Secondary | ICD-10-CM

## 2020-09-21 DIAGNOSIS — U071 COVID-19: Secondary | ICD-10-CM | POA: Diagnosis present

## 2020-09-21 LAB — SARS CORONAVIRUS 2 (TAT 6-24 HRS): SARS Coronavirus 2: POSITIVE — AB

## 2020-09-21 LAB — POC INFLUENZA A AND B ANTIGEN (URGENT CARE ONLY)
Influenza A Ag: NEGATIVE
Influenza B Ag: NEGATIVE

## 2020-09-21 MED ORDER — IBUPROFEN 800 MG PO TABS
800.0000 mg | ORAL_TABLET | Freq: Once | ORAL | Status: AC
  Administered 2020-09-21: 800 mg via ORAL

## 2020-09-21 MED ORDER — ACETAMINOPHEN 325 MG PO TABS
ORAL_TABLET | ORAL | Status: AC
  Filled 2020-09-21: qty 3

## 2020-09-21 MED ORDER — IBUPROFEN 800 MG PO TABS
ORAL_TABLET | ORAL | Status: AC
  Filled 2020-09-21: qty 1

## 2020-09-21 MED ORDER — ACETAMINOPHEN 325 MG PO TABS
975.0000 mg | ORAL_TABLET | Freq: Once | ORAL | Status: AC
  Administered 2020-09-21: 975 mg via ORAL

## 2020-09-21 MED ORDER — IBUPROFEN 600 MG PO TABS: 600.0000 mg | ORAL_TABLET | Freq: Four times a day (QID) | ORAL | 0 refills | Status: DC | PRN

## 2020-09-21 MED ORDER — BENZONATATE 200 MG PO CAPS: 200.0000 mg | ORAL_CAPSULE | Freq: Three times a day (TID) | ORAL | 0 refills | Status: DC | PRN

## 2020-09-21 MED ORDER — FLUTICASONE PROPIONATE 50 MCG/ACT NA SUSP: 2.0000 | Freq: Every day | NASAL | 0 refills | Status: DC

## 2020-09-21 NOTE — ED Triage Notes (Signed)
Pt reports since Monday he has experienced chills ,general body aches ,HA . Pt also con not taste food or smell anything.

## 2020-09-21 NOTE — Discharge Instructions (Addendum)
Your flu is negative. Push electrolyte containing fluids until your urine is clear Take 600 mg of ibuprofen combined with 1000 mg of Tylenol together 3-4 times a day as needed for body aches, headaches.  Tessalon for the cough.  Flonase, Mucinex or Mucinex D, saline nasal irrigation for the nasal congestion.  If your COVID is positive, get a pulse oximeter and monitor your oxygen saturation.  Go to the ER if it is consistently below 90%.  Make sure you get out and walk a little every day to keep your lungs open and prevent a blood clot.  Below is a list of primary care practices who are taking new patients for you to follow-up with.  [facility] internal medicine clinic Ground Floor - [facility], [address] [place], [place], [place] [address] [phone]  [facility] [address] [place], [place] [address] [phone]  [facility] [address] [place], [place] [address] [phone]  [facility] [phone] [address] [place] [address]  [facility]: [address] [place] [address]  [phone]  [facility]: [address] [place] [address]   [phone]  [facility]: [address] [place] [address]  [phone]  [facility] : [address] [place] [address] [phone]  [facility] Primary Care: [address] [place] [address] [phone]  [facility]: [address] [place] [address] [phone]  Dr. Oneal Grout [address] [facility] [place] [address]  [phone]  Dr. Jackie Plum, [facility]. [address] [place], [place] [address]  [phone]  Go to  www.goodrx.com to look up your medications. This will give you a list of where you can find your prescriptions at the most affordable prices. Or ask the pharmacist what the cash price is, or if they have any other discount programs available to help make your medication more affordable. This can be less expensive than what you would pay with insurance.

## 2020-09-21 NOTE — ED Provider Notes (Signed)
HPI  SUBJECTIVE:  Stephen Ward is a 40 y.o. male who presents with acute onset of headaches, body aches, loss of taste and smell starting yesterday.  He states this is identical to previous COVID infection that he had a Christmas 2020.  Reports nasal congestion, rhinorrhea, sore throat, cough, diarrhea.  No fevers, shortness of breath, nausea, vomiting, abdominal pain.  He had a second dose of [employer] Covid vaccine in spring 2021.  He did not get the flu vaccine.  No known Covid or flu exposure.  No antipyretic in the past 6 hours.  He has not tried anything for this.  No alleviating factors.  Symptoms worse with exertion.  He has no other past medical history.  VZD:[id number], No Pcp Per   Past Medical History:  Diagnosis Date  . Back pain   . Headache     History reviewed. No pertinent surgical history.  Family History  Problem Relation Age of Onset  . Seizures Mother   . Healthy Father     Social History   Tobacco Use  . Smoking status: Current Every Day Smoker    Packs/day: 0.50    Types: Cigarettes  . Smokeless tobacco: Never Used  Vaping Use  . Vaping Use: Never used  Substance Use Topics  . Alcohol use: No  . Drug use: No    No current facility-administered medications for this encounter.  Current Outpatient Medications:  .  benzonatate (TESSALON) 200 MG capsule, Take 1 capsule (200 mg total) by mouth 3 (three) times daily as needed for cough., Disp: 30 capsule, Rfl: 0 .  fluticasone (FLONASE) 50 MCG/ACT nasal spray, Place 2 sprays into both nostrils daily., Disp: 16 g, Rfl: 0 .  ibuprofen (ADVIL) 600 MG tablet, Take 1 tablet (600 mg total) by mouth every 6 (six) hours as needed., Disp: 30 tablet, Rfl: 0  Allergies  Allergen Reactions  . Bee Venom Anaphylaxis  . Bee Venom Anaphylaxis  . Bactrim [Sulfamethoxazole-Trimethoprim]     Localized drug reaction     ROS  As noted in HPI.   Physical Exam  BP 107/61 (BP Location: Right Arm)   Pulse (!) 104    Temp 100.2 F (37.9 C) (Oral)   Resp 20   SpO2 99%   Constitutional: Well developed, well nourished, no acute distress.  Appears ill. Eyes:  EOMI, conjunctiva normal bilaterally HENT: Normocephalic, atraumatic,mucus membranes moist no nasal congestion Respiratory: Normal inspiratory effort lungs clear bilaterally Cardiovascular: Regular tachycardia no murmurs rubs or gallops GI: nondistended skin: No rash, skin intact Musculoskeletal: no deformities Neurologic: Alert & oriented x 3, no focal neuro deficits Psychiatric: Speech and behavior appropriate   ED Course   Medications  acetaminophen (TYLENOL) tablet 975 mg (975 mg Oral Given 09/21/20 1401)  ibuprofen (ADVIL) tablet 800 mg (800 mg Oral Given 09/21/20 1401)    Orders Placed This Encounter  Procedures  . SARS CORONAVIRUS 2 (TAT 6-24 HRS) Nasopharyngeal Nasopharyngeal Swab    Standing Status:   Standing    Number of Occurrences:   1    Order Specific Question:   Is this test for diagnosis or screening    Answer:   Diagnosis of ill patient    Order Specific Question:   Symptomatic for COVID-19 as defined by CDC    Answer:   Yes    Order Specific Question:   Date of Symptom Onset    Answer:   09/20/2020    Order Specific Question:   Hospitalized for  COVID-19    Answer:   No    Order Specific Question:   Admitted to ICU for COVID-19    Answer:   No    Order Specific Question:   Previously tested for COVID-19    Answer:   No    Order Specific Question:   Resident in a congregate (group) care setting    Answer:   No    Order Specific Question:   Employed in healthcare setting    Answer:   No    Order Specific Question:   Has patient completed COVID vaccination(s) (2 doses of Pfizer/Moderna 1 dose of [employer])    Answer:   Yes  . POC Influenza A & B Ag (Urgent Care)    Standing Status:   Standing    Number of Occurrences:   1    Results for orders placed or performed during the hospital encounter of 09/21/20  (from the past 24 hour(s))  SARS CORONAVIRUS 2 (TAT 6-24 HRS) Nasopharyngeal Nasopharyngeal Swab     Status: Abnormal   Collection Time: 09/21/20  1:47 PM   Specimen: Nasopharyngeal Swab  Result Value Ref Range   SARS Coronavirus 2 POSITIVE (A) NEGATIVE  POC Influenza A & B Ag (Urgent Care)     Status: None   Collection Time: 09/21/20  2:22 PM  Result Value Ref Range   Influenza A Ag NEGATIVE NEGATIVE   Influenza B Ag NEGATIVE NEGATIVE   No results found.  ED Clinical Impression  1. COVID-19 virus infection   2. Viral illness   3. Encounter for laboratory testing for COVID-19 virus      ED Assessment/Plan  Suspect COVID.  Will check for flu.  Will send home with Tylenol/ibuprofen, Tessalon, Flonase, Mucinex/Mucinex D, if Covid is positive, advised him to buy a pulse oximeter and to walk every day to prevent PE.  Will order primary care follow-up and provide primary care list.  Flu negative.  Result not crossing over.  Plan as above.  COVID positive.  Does not meet criteria for antiviral/  monoclonal antibody therapy.  Supportive treatment.  Discussed labs,  MDM, treatment plan, and plan for follow-up with patient. Discussed sn/sx that should prompt return to the ED. patient agrees with plan.   Meds ordered this encounter  Medications  . acetaminophen (TYLENOL) tablet 975 mg  . ibuprofen (ADVIL) tablet 800 mg  . benzonatate (TESSALON) 200 MG capsule    Sig: Take 1 capsule (200 mg total) by mouth 3 (three) times daily as needed for cough.    Dispense:  30 capsule    Refill:  0  . ibuprofen (ADVIL) 600 MG tablet    Sig: Take 1 tablet (600 mg total) by mouth every 6 (six) hours as needed.    Dispense:  30 tablet    Refill:  0  . fluticasone (FLONASE) 50 MCG/ACT nasal spray    Sig: Place 2 sprays into both nostrils daily.    Dispense:  16 g    Refill:  0    *This clinic note was created using Scientist, clinical (histocompatibility and immunogenetics). Therefore, there may be occasional mistakes despite  careful proofreading.   ?    Domenick Gong, MD 09/22/20 [phone]

## 2020-09-22 ENCOUNTER — Encounter: Payer: Self-pay | Admitting: Oncology

## 2020-09-22 ENCOUNTER — Telehealth: Payer: Self-pay | Admitting: Oncology

## 2020-09-22 NOTE — Telephone Encounter (Signed)
Called to discuss with patient about COVID-19 symptoms and the use of one of the available treatments for those with mild to moderate Covid symptoms and at a high risk of hospitalization.  Pt appears to qualify for outpatient treatment due to co-morbid conditions and/or a member of an at-risk group in accordance with the FDA Emergency Use Authorization.    Symptom onset: 09/20/20 Vaccinated: Yes Booster? No Immunocompromised? No Qualifiers: Obesity   Spoke with patient and he is interested in looking over materials on the oral antivirals.  Information sent via MyChart.  Patient will call if he has questions or is interested in getting a prescription.  Stephen Ward

## 2021-01-05 ENCOUNTER — Encounter (HOSPITAL_COMMUNITY): Payer: Self-pay

## 2021-01-05 ENCOUNTER — Ambulatory Visit (HOSPITAL_COMMUNITY)
Admission: EM | Admit: 2021-01-05 | Discharge: 2021-01-05 | Disposition: A | Discharge status: Home / Self Care | Payer: Self-pay | Attending: Family Medicine | Admitting: Family Medicine

## 2021-01-05 DIAGNOSIS — K0889 Other specified disorders of teeth and supporting structures: Secondary | ICD-10-CM

## 2021-01-05 MED ORDER — IBUPROFEN 800 MG PO TABS: 800.0000 mg | ORAL_TABLET | Freq: Three times a day (TID) | ORAL | 0 refills | Status: DC

## 2021-01-05 MED ORDER — AMOXICILLIN 875 MG PO TABS: 875.0000 mg | ORAL_TABLET | Freq: Two times a day (BID) | ORAL | 0 refills | Status: AC

## 2021-01-05 MED ORDER — HYDROCODONE-ACETAMINOPHEN 5-325 MG PO TABS: 1.0000 | ORAL_TABLET | Freq: Four times a day (QID) | ORAL | 0 refills | Status: DC | PRN

## 2021-01-05 NOTE — ED Triage Notes (Signed)
Pt presents with right side dental pain X 3 days. 

## 2021-01-05 NOTE — Discharge Instructions (Signed)

## 2021-01-05 NOTE — ED Provider Notes (Signed)
[facility]   [id number] 01/05/21 Arrival Time: 0936  ASSESSMENT & PLAN:  1. Pain, dental    No sign of abscess requiring I&D at this time. Discussed.  Meds ordered this encounter  Medications  . ibuprofen (ADVIL) 800 MG tablet    Sig: Take 1 tablet (800 mg total) by mouth 3 (three) times daily with meals.    Dispense:  21 tablet    Refill:  0  . HYDROcodone-acetaminophen (NORCO/VICODIN) 5-325 MG tablet    Sig: Take 1 tablet by mouth every 6 (six) hours as needed for moderate pain or severe pain.    Dispense:  8 tablet    Refill:  0  . amoxicillin (AMOXIL) 875 MG tablet    Sig: Take 1 tablet (875 mg total) by mouth 2 (two) times daily for 10 days.    Dispense:  20 tablet    Refill:  0    [place] Controlled Substances Registry consulted for this patient. I feel the risk/benefit ratio today is favorable for proceeding with this prescription for a controlled substance. Medication sedation precautions given.  Dental resource written instructions given. He will schedule dental evaluation as soon as possible if not improving over the next 24-48 hours.  Reviewed expectations re: course of current medical issues. Questions answered. Outlined signs and symptoms indicating need for more acute intervention. Patient verbalized understanding. After Visit Summary given.   SUBJECTIVE:  Stephen Ward is a 40 y.o. male who reports gradual onset of right upper, right lower dental pain described as aching/throbbing. Present for 2-3 days. Fever: absent. Tolerating PO intake but reports pain with chewing. Normal swallowing. He does not see a dentist regularly. No neck swelling or pain. OTC analgesics without relief.  OBJECTIVE: Vitals:   01/05/21 1026  BP: 127/64  Pulse: 75  Resp: 18  Temp: 98.1 F (36.7 C)  TempSrc: Oral  SpO2: 100%    General appearance: alert; no distress HENT: normocephalic; atraumatic; dentition: fair; right upper, right lower gums without areas of  fluctuance, drainage, or bleeding and with tenderness to palpation; normal jaw movement without difficulty Neck: supple without LAD; FROM; trachea midline Lungs: normal respirations; unlabored; speaks full sentences without difficulty Skin: warm and dry Psychological: alert and cooperative; normal mood and affect  Allergies  Allergen Reactions  . Bee Venom Anaphylaxis  . Bee Venom Anaphylaxis  . Bactrim [Sulfamethoxazole-Trimethoprim]     Localized drug reaction    Past Medical History:  Diagnosis Date  . Back pain   . Headache    Social History   Socioeconomic History  . Marital status: Significant Other    Spouse name: Not on file  . Number of children: Not on file  . Years of education: Not on file  . Highest education level: Not on file  Occupational History  . Not on file  Tobacco Use  . Smoking status: Current Every Day Smoker    Packs/day: 0.50    Types: Cigarettes  . Smokeless tobacco: Never Used  Vaping Use  . Vaping Use: Never used  Substance and Sexual Activity  . Alcohol use: No  . Drug use: No  . Sexual activity: Yes    Birth control/protection: Condom  Other Topics Concern  . Not on file  Social History Narrative   ** Merged History Encounter **       Social Determinants of Health   Financial Resource Strain: Not on file  Food Insecurity: Not on file  Transportation Needs: Not on file  Physical Activity: Not on file  Stress: Not on file  Social Connections: Not on file  Intimate Partner Violence: Not on file   Family History  Problem Relation Age of Onset  . Seizures Mother   . Healthy Father    History reviewed. No pertinent surgical history.   Mardella Layman, MD 01/05/21 [phone]

## 2021-03-24 ENCOUNTER — Emergency Department (HOSPITAL_COMMUNITY)
Admission: EM | Admit: 2021-03-24 | Discharge: 2021-03-24 | Disposition: A | Discharge status: Home / Self Care | Payer: Self-pay | Attending: Emergency Medicine | Admitting: Emergency Medicine

## 2021-03-24 ENCOUNTER — Encounter (HOSPITAL_COMMUNITY): Payer: Self-pay

## 2021-03-24 DIAGNOSIS — K0889 Other specified disorders of teeth and supporting structures: Secondary | ICD-10-CM | POA: Insufficient documentation

## 2021-03-24 DIAGNOSIS — Z5321 Procedure and treatment not carried out due to patient leaving prior to being seen by health care provider: Secondary | ICD-10-CM | POA: Insufficient documentation

## 2021-03-24 NOTE — ED Provider Notes (Signed)
Emergency Medicine Provider Triage Evaluation Note  Stephen Ward , a 40 y.o. male  was evaluated in triage.  Pt complains of dental pain.  Patient reports that he has been having several days of right upper lower dental pain.  However, pain is significantly worsened this afternoon when part of his tooth broke off while he was eating lunch.  He has been taking Motrin and Tylenol at home without improvement in his symptoms.  He has also tried wrapping a towel around his head to help improve the pain.  Earlier tonight, he tried to go outside to see if the cool air would help his symptoms.  He took about 4 steps and the pain suddenly worsened, and the patient states that bystanders told him that he had a loss of consciousness.  He fell to the ground from standing.  Bystanders told him that he hit his head.  He reports that he was feeling a little woozy after the incident, but reports that symptoms have resolved.  He suspects that he may have passed out due to the pain.  He denies neck pain, visual changes, numbness, weakness, chest pain, shortness of breath, abdominal pain, vomiting, throat closing, tongue swelling, facial swelling, fever, chills, or drainage from the area.  Review of Systems  Positive: Dental pain, syncope Negative: Pain, tongue swelling, throat closing, sore throat, fever, chills, drainage from the wound, chest pain, shortness of breath, abdominal pain, vomiting, numbness, weakness, visual changes, headache  Physical Exam  BP 121/88   Pulse 81   Resp 18   SpO2 99%  Gen:   Awake, no distress   Resp:  Normal effort  MSK:   Moves extremities without difficulty  Other:  Right upper tooth is fractured.  No dental abscess.  No facial swelling.  No trauma noted to the head.  Ambulates without difficulty.  Medical Decision Making  Medically screening exam initiated at 1:00 AM.  Appropriate orders placed.  Herma Mering was informed that the remainder of the evaluation will be completed  by another provider, this initial triage assessment does not replace that evaluation, and the importance of remaining in the ED until their evaluation is complete.  40 year old male presenting with dental pain for the last week that worsened today 1 part of his tooth broke off while he was eating lunch.  Of note, patient adds that pain worsened earlier tonight and he had a syncopal episode due to the intensity of the pain that was witnessed.  He denies headache, or any complaints related to the syncopal episode.  He will require further work-up and evaluation in the emergency department.   Frederik Pear A, PA-C 03/24/21 0105    Glynn Octave, MD 03/24/21 [phone]

## 2021-03-24 NOTE — ED Triage Notes (Signed)
Pt states that he was eating today and broke his R upper tooth, pt reports the pain is so bad that he broke his tooth.

## 2021-03-24 NOTE — ED Notes (Signed)
PT LWBS °

## 2021-06-30 ENCOUNTER — Other Ambulatory Visit: Payer: Self-pay

## 2021-06-30 ENCOUNTER — Ambulatory Visit (HOSPITAL_COMMUNITY)
Admission: EM | Admit: 2021-06-30 | Discharge: 2021-06-30 | Disposition: A | Discharge status: Home / Self Care | Payer: 59 | Attending: Student | Admitting: Student

## 2021-06-30 ENCOUNTER — Encounter (HOSPITAL_COMMUNITY): Payer: Self-pay | Admitting: Emergency Medicine

## 2021-06-30 DIAGNOSIS — K047 Periapical abscess without sinus: Secondary | ICD-10-CM | POA: Diagnosis not present

## 2021-06-30 MED ORDER — AMOXICILLIN-POT CLAVULANATE 875-125 MG PO TABS: 1.0000 | ORAL_TABLET | Freq: Two times a day (BID) | ORAL | 0 refills | Status: DC

## 2021-06-30 MED ORDER — LIDOCAINE VISCOUS HCL 2 % MT SOLN: 15.0000 mL | OROMUCOSAL | 0 refills | Status: DC | PRN

## 2021-06-30 NOTE — ED Triage Notes (Signed)
Pt presents with dental pain that increased yesterday. States has been taking ibuprofen for the pain.

## 2021-06-30 NOTE — Discharge Instructions (Addendum)
-  Start the antibiotic-Augmentin (amoxicillin-clavulanate), 1 pill every 12 hours for 7 days.  You can take this with food like with breakfast and dinner. -For dental pain, use lidocaine mouthwash up to every 4 hours. Make sure not to eat for at least 1 hour after using this, as your mouth will be very numb and you could bite yourself. -You can take Tylenol up to 1000 mg 3 times daily, and ibuprofen up to 600 mg 3 times daily with food.  You can take these together, or alternate every 3-4 hours.

## 2021-06-30 NOTE — ED Provider Notes (Signed)
[facility]    CSN: [id number] Arrival date & time: 06/30/21  0800      History   Chief Complaint Chief Complaint  Patient presents with   Dental Pain    HPI Stephen Ward is a 40 y.o. male presenting with dental pain. Medical history dental pain.  Describes 2 days of right upper dental pain, minimally controlled with ibuprofen.  Has had this issue before, does not have a dentist.  Denies foul taste in mouth, pain under tongue, pain under jaw, sore throat, trouble swallowing, fever/chills.  HPI  Past Medical History:  Diagnosis Date   Back pain    Headache     Patient Active Problem List   Diagnosis Date Noted   Renal vein laceration, right, initial encounter 09/02/2017    History reviewed. No pertinent surgical history.     Home Medications    Prior to Admission medications   Medication Sig Start Date End Date Taking? Authorizing Provider  amoxicillin-clavulanate (AUGMENTIN) 875-125 MG tablet Take 1 tablet by mouth every 12 (twelve) hours. 06/30/21  Yes Rhys Martini, PA-C  lidocaine (XYLOCAINE) 2 % solution Use as directed 15 mLs in the mouth or throat as needed for mouth pain. 06/30/21  Yes Rhys Martini, PA-C  benzonatate (TESSALON) 200 MG capsule Take 1 capsule (200 mg total) by mouth 3 (three) times daily as needed for cough. 09/21/20   Domenick Gong, MD  fluticasone (FLONASE) 50 MCG/ACT nasal spray Place 2 sprays into both nostrils daily. 09/21/20   Domenick Gong, MD  HYDROcodone-acetaminophen (NORCO/VICODIN) 5-325 MG tablet Take 1 tablet by mouth every 6 (six) hours as needed for moderate pain or severe pain. 01/05/21   Mardella Layman, MD  ibuprofen (ADVIL) 800 MG tablet Take 1 tablet (800 mg total) by mouth 3 (three) times daily with meals. 01/05/21   Mardella Layman, MD    Family History Family History  Problem Relation Age of Onset   Seizures Mother    Healthy Father     Social History Social History   Tobacco Use   Smoking  status: Every Day    Packs/day: 0.50    Types: Cigarettes   Smokeless tobacco: Never  Vaping Use   Vaping Use: Never used  Substance Use Topics   Alcohol use: No   Drug use: No     Allergies   Bee venom, Bee venom, and Bactrim [sulfamethoxazole-trimethoprim]   Review of Systems Review of Systems  HENT:  Positive for dental problem.   All other systems reviewed and are negative.   Physical Exam Triage Vital Signs ED Triage Vitals  Enc Vitals Group     BP      Pulse      Resp      Temp      Temp src      SpO2      Weight      Height      Head Circumference      Peak Flow      Pain Score      Pain Loc      Pain Edu?      Excl. in GC?    No data found.  Updated Vital Signs BP 113/76 (BP Location: Right Arm)   Pulse 61   Temp 98.9 F (37.2 C) (Oral)   Resp 16   SpO2 96%   Visual Acuity Right Eye Distance:   Left Eye Distance:   Bilateral Distance:    Right Eye  Near:   Left Eye Near:    Bilateral Near:     Physical Exam Vitals reviewed.  Constitutional:      General: He is not in acute distress.    Appearance: Normal appearance. He is not ill-appearing, toxic-appearing or diaphoretic.  HENT:     Head: Normocephalic and atraumatic.     Jaw: There is normal jaw occlusion. No trismus, tenderness, swelling, pain on movement or malocclusion.     Salivary Glands: Right salivary gland is not diffusely enlarged or tender. Left salivary gland is not diffusely enlarged or tender.     Right Ear: Hearing normal.     Left Ear: Hearing normal.     Nose: Nose normal.     Mouth/Throat:     Lips: Pink.     Mouth: Mucous membranes are moist. No lacerations or oral lesions.     Dentition: Abnormal dentition. Does not have dentures. Dental tenderness and dental caries present.     Tongue: No lesions. Tongue does not deviate from midline.     Palate: No mass.     Pharynx: Oropharynx is clear. Uvula midline. No oropharyngeal exudate or posterior oropharyngeal  erythema.     Tonsils: No tonsillar exudate or tonsillar abscesses.     Comments: Poor dentician  Gingival tenderness surrounding R upper molars without obvious swelling erythema abscess. No trismus, drooling, sore throat, voice changes, swelling underneath the tongue, swelling underneath the jaw, neck stiffness.  Eyes:     Extraocular Movements: Extraocular movements intact.     Pupils: Pupils are equal, round, and reactive to light.  Pulmonary:     Effort: Pulmonary effort is normal.  Neurological:     General: No focal deficit present.     Mental Status: He is alert and oriented to person, place, and time.  Psychiatric:        Mood and Affect: Mood normal.        Behavior: Behavior normal.        Thought Content: Thought content normal.        Judgment: Judgment normal.     UC Treatments / Results  Labs (all labs ordered are listed, but only abnormal results are displayed) Labs Reviewed - No data to display  EKG   Radiology No results found.  Procedures Procedures (including critical care time)  Medications Ordered in UC Medications - No data to display  Initial Impression / Assessment and Plan / UC Course  I have reviewed the triage vital signs and the nursing notes.  Pertinent labs & imaging results that were available during my care of the patient were reviewed by me and considered in my medical decision making (see chart for details).     This patient is a very pleasant 40 y.o. year old male presenting with dental infection. Today this pt is afebrile nontachycardic nontachypneic. Augmentin, viscous lidocaine sent. ED return precautions discussed. Patient verbalizes understanding and agreement.   .   Final Clinical Impressions(s) / UC Diagnoses   Final diagnoses:  Dental infection     Discharge Instructions      -Start the antibiotic-Augmentin (amoxicillin-clavulanate), 1 pill every 12 hours for 7 days.  You can take this with food like with breakfast  and dinner. -For dental pain, use lidocaine mouthwash up to every 4 hours. Make sure not to eat for at least 1 hour after using this, as your mouth will be very numb and you could bite yourself. -You can take Tylenol up to 1000 mg  3 times daily, and ibuprofen up to 600 mg 3 times daily with food.  You can take these together, or alternate every 3-4 hours.      ED Prescriptions     Medication Sig Dispense Auth. Provider   amoxicillin-clavulanate (AUGMENTIN) 875-125 MG tablet Take 1 tablet by mouth every 12 (twelve) hours. 14 tablet Ignacia Bayley E, PA-C   lidocaine (XYLOCAINE) 2 % solution Use as directed 15 mLs in the mouth or throat as needed for mouth pain. 100 mL Rhys Martini, PA-C      PDMP not reviewed this encounter.   Rhys Martini, PA-C 06/30/21 [phone]

## 2021-09-17 ENCOUNTER — Encounter (HOSPITAL_COMMUNITY): Payer: Self-pay | Admitting: *Deleted

## 2021-09-17 ENCOUNTER — Ambulatory Visit (HOSPITAL_COMMUNITY)
Admission: EM | Admit: 2021-09-17 | Discharge: 2021-09-17 | Disposition: A | Discharge status: Home / Self Care | Payer: 59 | Attending: Student | Admitting: Student

## 2021-09-17 ENCOUNTER — Other Ambulatory Visit: Payer: Self-pay

## 2021-09-17 DIAGNOSIS — M5431 Sciatica, right side: Secondary | ICD-10-CM

## 2021-09-17 MED ORDER — KETOROLAC TROMETHAMINE 10 MG PO TABS: 10.0000 mg | ORAL_TABLET | Freq: Four times a day (QID) | ORAL | 1 refills | Status: DC | PRN

## 2021-09-17 MED ORDER — METHYLPREDNISOLONE SODIUM SUCC 125 MG IJ SOLR
INTRAMUSCULAR | Status: AC
Start: 2021-09-17 — End: ?
  Filled 2021-09-17: qty 2

## 2021-09-17 MED ORDER — METHYLPREDNISOLONE SODIUM SUCC 125 MG IJ SOLR
80.0000 mg | Freq: Once | INTRAMUSCULAR | Status: AC
  Administered 2021-09-17: 80 mg via INTRAMUSCULAR

## 2021-09-17 MED ORDER — TIZANIDINE HCL 4 MG PO TABS: 4.0000 mg | ORAL_TABLET | Freq: Three times a day (TID) | ORAL | 0 refills | Status: DC | PRN

## 2021-09-17 NOTE — Discharge Instructions (Addendum)
-  Toradol (Ketorolac), one pill up to every 6 hours for pain. Make sure to take this with food. Avoid other NSAIDs like ibuprofen, alleve, naproxen while taking this medication.  -You can also take Tylenol 1000mg  up to 3x daily -Start the muscle relaxer-Zanaflex (tizanidine), up to 3 times daily for muscle spasms and pain.  This can make you drowsy, so take at bedtime or when you do not need to drive or operate machinery. -Rest, heating pad

## 2021-09-17 NOTE — ED Triage Notes (Signed)
Pt reports Rt hip and RT leg pain for one week. Pt reports his friend gave him a perc-10 which took care of the pain.

## 2021-09-17 NOTE — ED Provider Notes (Signed)
[facility]    CSN: [id number] Arrival date & time: 09/17/21  1355      History   Chief Complaint Chief Complaint  Patient presents with   Hip Pain    Rt   Leg Pain    Rt    HPI Stephen Ward is a 41 y.o. male presenting with R sciatica. Medical history back pain but no formal diagnoses.  Describes right hip and leg pain for 1 week. Pain radiating from hip down to R posterior calf. Worse with standing and prolonged sitting.  Endorses overuse as he stands all day at work, but denies trauma.  Denies numbness in arms/legs, denies weakness in arms/legs, denies saddle anesthesia, denies bowel/bladder incontinence, denies urinary retention, denies constipation. Took a Percocet-10 given by a family member and this did resolve the pain temporarily.  States he needs a large quantity of Percocet or oxycodone for the pain.  HPI  Past Medical History:  Diagnosis Date   Back pain    Headache     Patient Active Problem List   Diagnosis Date Noted   Renal vein laceration, right, initial encounter 09/02/2017    History reviewed. No pertinent surgical history.     Home Medications    Prior to Admission medications   Medication Sig Start Date End Date Taking? Authorizing Provider  ketorolac (TORADOL) 10 MG tablet Take 1 tablet (10 mg total) by mouth every 6 (six) hours as needed. 09/17/21  Yes Rhys MartiniGraham, Rayvn Rickerson E, PA-C  tiZANidine (ZANAFLEX) 4 MG tablet Take 1 tablet (4 mg total) by mouth every 8 (eight) hours as needed for muscle spasms. 09/17/21  Yes Rhys MartiniGraham, Lazer Wollard E, PA-C  fluticasone (FLONASE) 50 MCG/ACT nasal spray Place 2 sprays into both nostrils daily. 09/21/20   Domenick GongMortenson, Ashley, MD  ibuprofen (ADVIL) 800 MG tablet Take 1 tablet (800 mg total) by mouth 3 (three) times daily with meals. 01/05/21   Mardella LaymanHagler, Brian, MD  lidocaine (XYLOCAINE) 2 % solution Use as directed 15 mLs in the mouth or throat as needed for mouth pain. 06/30/21   Rhys MartiniGraham, Siddiq Kaluzny E, PA-C    Family  History Family History  Problem Relation Age of Onset   Seizures Mother    Healthy Father     Social History Social History   Tobacco Use   Smoking status: Every Day    Packs/day: 0.50    Types: Cigarettes   Smokeless tobacco: Never  Vaping Use   Vaping Use: Never used  Substance Use Topics   Alcohol use: No   Drug use: No     Allergies   Bee venom, Bee venom, and Bactrim [sulfamethoxazole-trimethoprim]   Review of Systems Review of Systems  Musculoskeletal:        R sciatica   All other systems reviewed and are negative.   Physical Exam Triage Vital Signs ED Triage Vitals  Enc Vitals Group     BP      Pulse      Resp      Temp      Temp src      SpO2      Weight      Height      Head Circumference      Peak Flow      Pain Score      Pain Loc      Pain Edu?      Excl. in GC?    No data found.  Updated Vital Signs BP 111/80  Pulse 73    Temp 98.8 F (37.1 C)    Resp 18    SpO2 97%   Visual Acuity Right Eye Distance:   Left Eye Distance:   Bilateral Distance:    Right Eye Near:   Left Eye Near:    Bilateral Near:     Physical Exam Vitals reviewed.  Constitutional:      General: He is not in acute distress.    Appearance: Normal appearance. He is not ill-appearing.  HENT:     Head: Normocephalic and atraumatic.  Cardiovascular:     Rate and Rhythm: Normal rate and regular rhythm.     Heart sounds: Normal heart sounds.  Pulmonary:     Effort: Pulmonary effort is normal.     Breath sounds: Normal breath sounds and air entry.  Abdominal:     Tenderness: There is no abdominal tenderness. There is no right CVA tenderness, left CVA tenderness, guarding or rebound.  Musculoskeletal:     Cervical back: Normal range of motion. No swelling, deformity, signs of trauma, rigidity, spasms, tenderness, bony tenderness or crepitus. No pain with movement.     Thoracic back: No swelling, deformity, signs of trauma, spasms, tenderness or bony  tenderness. Normal range of motion. No scoliosis.     Lumbar back: No swelling, deformity, signs of trauma, spasms, tenderness or bony tenderness. Normal range of motion. Positive right straight leg raise test. Negative left straight leg raise test. No scoliosis.     Comments: Positive R straight leg raise. Walking on toes and heels intact. Strength and sensation grossly intact. No paraspinsous tenderness. No midline spinous tenderness, deformity, stepoff.  Absolutely no other injury, deformity, tenderness, ecchymosis, abrasion.  Neurological:     General: No focal deficit present.     Mental Status: He is alert.     Cranial Nerves: No cranial nerve deficit.  Psychiatric:        Mood and Affect: Mood normal.        Behavior: Behavior normal.        Thought Content: Thought content normal.        Judgment: Judgment normal.     UC Treatments / Results  Labs (all labs ordered are listed, but only abnormal results are displayed) Labs Reviewed - No data to display  EKG   Radiology No results found.  Procedures Procedures (including critical care time)  Medications Ordered in UC Medications  methylPREDNISolone sodium succinate (SOLU-MEDROL) 125 mg/2 mL injection 80 mg (has no administration in time range)    Initial Impression / Assessment and Plan / UC Course  I have reviewed the triage vital signs and the nursing notes.  Pertinent labs & imaging results that were available during my care of the patient were reviewed by me and considered in my medical decision making (see chart for details).     This patient is a very pleasant 41 y.o. year old male presenting with sciatica. No red flag symptoms. No trauma but does endorse overuse.  Patient requested narcotic pain control multiple times, discussed our controlled substances policy, and advised him that this would not be appropriate management.  Recommended stopping taking family member's Percocet.  Toradol p.o. sent, Zanaflex p.o.  sent.  IM Solu-Medrol ministered today. Work note provided. ED return precautions discussed. Patient verbalizes understanding and agreement.     Final Clinical Impressions(s) / UC Diagnoses   Final diagnoses:  Sciatica of right side     Discharge Instructions      -  Toradol (Ketorolac), one pill up to every 6 hours for pain. Make sure to take this with food. Avoid other NSAIDs like ibuprofen, alleve, naproxen while taking this medication.  -You can also take Tylenol 1000mg  up to 3x daily -Start the muscle relaxer-Zanaflex (tizanidine), up to 3 times daily for muscle spasms and pain.  This can make you drowsy, so take at bedtime or when you do not need to drive or operate machinery. -Rest, heating pad      ED Prescriptions     Medication Sig Dispense Auth. Provider   ketorolac (TORADOL) 10 MG tablet Take 1 tablet (10 mg total) by mouth every 6 (six) hours as needed. 20 tablet , PA-C   tiZANidine (ZANAFLEX) 4 MG tablet Take 1 tablet (4 mg total) by mouth every 8 (eight) hours as needed for muscle spasms. 21 tablet Rhys Martini, PA-C      PDMP not reviewed this encounter.   Rhys Martini, PA-C 09/17/21 1600

## 2021-09-21 ENCOUNTER — Emergency Department (HOSPITAL_COMMUNITY)
Admission: EM | Admit: 2021-09-21 | Discharge: 2021-09-21 | Disposition: A | Discharge status: Home / Self Care | Payer: 59 | Attending: Emergency Medicine | Admitting: Emergency Medicine

## 2021-09-21 DIAGNOSIS — M545 Low back pain, unspecified: Secondary | ICD-10-CM | POA: Insufficient documentation

## 2021-09-21 DIAGNOSIS — Z5321 Procedure and treatment not carried out due to patient leaving prior to being seen by health care provider: Secondary | ICD-10-CM | POA: Insufficient documentation

## 2021-09-21 DIAGNOSIS — M79606 Pain in leg, unspecified: Secondary | ICD-10-CM | POA: Diagnosis not present
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
## 2021-09-21 NOTE — ED Notes (Signed)
Pt was called no answer x3

## 2021-09-21 NOTE — ED Notes (Signed)
Pt did not answer when called to go back for a bed.
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
## 2021-09-21 NOTE — ED Notes (Signed)
Pt did not answer when called x3 

## 2021-09-22 ENCOUNTER — Other Ambulatory Visit: Payer: Self-pay

## 2021-09-22 ENCOUNTER — Encounter (HOSPITAL_COMMUNITY): Payer: Self-pay | Admitting: Oncology

## 2021-09-22 ENCOUNTER — Emergency Department (HOSPITAL_COMMUNITY)
Admission: EM | Admit: 2021-09-22 | Discharge: 2021-09-22 | Disposition: A | Discharge status: Home / Self Care | Payer: 59 | Attending: Emergency Medicine | Admitting: Emergency Medicine

## 2021-09-22 DIAGNOSIS — M545 Low back pain, unspecified: Secondary | ICD-10-CM | POA: Diagnosis not present

## 2021-09-22 DIAGNOSIS — M549 Dorsalgia, unspecified: Secondary | ICD-10-CM | POA: Diagnosis not present

## 2021-09-22 DIAGNOSIS — M5441 Lumbago with sciatica, right side: Secondary | ICD-10-CM | POA: Diagnosis not present

## 2021-09-22 DIAGNOSIS — Z743 Need for continuous supervision: Secondary | ICD-10-CM | POA: Diagnosis not present

## 2021-09-22 DIAGNOSIS — M5431 Sciatica, right side: Secondary | ICD-10-CM

## 2021-09-22 MED ORDER — OXYCODONE HCL 5 MG PO TABS: 5.0000 mg | ORAL_TABLET | Freq: Four times a day (QID) | ORAL | 0 refills | Status: DC | PRN

## 2021-09-22 MED ORDER — METHYLPREDNISOLONE 4 MG PO TBPK: ORAL_TABLET | ORAL | 0 refills | Status: DC

## 2021-09-22 MED ORDER — ACETAMINOPHEN 500 MG PO TABS
1000.0000 mg | ORAL_TABLET | Freq: Once | ORAL | Status: AC
  Administered 2021-09-22: 1000 mg via ORAL
  Filled 2021-09-22: qty 2

## 2021-09-22 MED ORDER — OXYCODONE HCL 5 MG PO TABS
5.0000 mg | ORAL_TABLET | Freq: Once | ORAL | Status: AC
  Administered 2021-09-22: 5 mg via ORAL
  Filled 2021-09-22: qty 1

## 2021-09-22 MED ORDER — LIDOCAINE 5 % EX PTCH
1.0000 | MEDICATED_PATCH | CUTANEOUS | Status: DC
  Administered 2021-09-22: 1 via TRANSDERMAL
  Filled 2021-09-22: qty 1

## 2021-09-22 NOTE — ED Provider Notes (Signed)
[facility]-EMERGENCY DEPT Provider Note   CSN: [id number] Arrival date & time: 09/22/21  1215     History  Chief Complaint  Patient presents with   Back Pain    Stephen MeringJames A Skeels is a 41 y.o. male.  The history is provided by the patient.  Back Pain Location:  Lumbar spine Quality:  Aching Radiates to:  R thigh Pain severity:  Moderate Onset quality:  Gradual Duration:  4 days Timing:  Constant Progression:  Unchanged Chronicity:  New Context comment:  Pain, from his right buttock down.  History of the same.  Works Youth workermanual labor job.  Has tried Toradol and tenacity with not much relief.  Denies any loss of bowel or bladder.  No weakness or numbness. Relieved by:  Nothing Worsened by:  Palpation, bending and ambulation Associated symptoms: paresthesias   Associated symptoms: no abdominal pain, no abdominal swelling, no bladder incontinence, no bowel incontinence, no chest pain, no dysuria, no fever, no headaches, no leg pain, no numbness, no pelvic pain, no perianal numbness, no tingling, no weakness and no weight loss       Home Medications Prior to Admission medications   Medication Sig Start Date End Date Taking? Authorizing Provider  methylPREDNISolone (MEDROL DOSEPAK) 4 MG TBPK tablet Follow package insert 09/22/21  Yes Jodene Polyak, DO  oxyCODONE (ROXICODONE) 5 MG immediate release tablet Take 1 tablet (5 mg total) by mouth every 6 (six) hours as needed for up to 10 doses for breakthrough pain. 09/22/21  Yes Vandy Fong, DO  fluticasone (FLONASE) 50 MCG/ACT nasal spray Place 2 sprays into both nostrils daily. 09/21/20   Domenick GongMortenson, Ashley, MD  ibuprofen (ADVIL) 800 MG tablet Take 1 tablet (800 mg total) by mouth 3 (three) times daily with meals. 01/05/21   Mardella LaymanHagler, Brian, MD  ketorolac (TORADOL) 10 MG tablet Take 1 tablet (10 mg total) by mouth every 6 (six) hours as needed. 09/17/21   Rhys MartiniGraham, Laura E, PA-C  lidocaine (XYLOCAINE) 2 % solution Use as  directed 15 mLs in the mouth or throat as needed for mouth pain. 06/30/21   Rhys MartiniGraham, Laura E, PA-C  tiZANidine (ZANAFLEX) 4 MG tablet Take 1 tablet (4 mg total) by mouth every 8 (eight) hours as needed for muscle spasms. 09/17/21   Rhys MartiniGraham, Laura E, PA-C      Allergies    Bee venom, Bee venom, and Bactrim [sulfamethoxazole-trimethoprim]    Review of Systems   Review of Systems  Constitutional:  Negative for fever and weight loss.  Cardiovascular:  Negative for chest pain.  Gastrointestinal:  Negative for abdominal pain and bowel incontinence.  Genitourinary:  Negative for bladder incontinence, dysuria and pelvic pain.  Musculoskeletal:  Positive for back pain.  Neurological:  Positive for paresthesias. Negative for tingling, weakness, numbness and headaches.   Physical Exam Updated Vital Signs BP (!) 139/97 (BP Location: Left Arm)    Pulse 71    Temp 97.7 F (36.5 C) (Oral)    Resp 16    SpO2 99%  Physical Exam Constitutional:      General: He is not in acute distress.    Appearance: He is not ill-appearing.  Eyes:     Pupils: Pupils are equal, round, and reactive to light.  Cardiovascular:     Rate and Rhythm: Normal rate.     Pulses: Normal pulses.     Heart sounds: Normal heart sounds.  Pulmonary:     Effort: Pulmonary effort is normal.  Musculoskeletal:  General: Tenderness present. Normal range of motion.     Cervical back: Normal range of motion.     Comments: Tenderness to the paraspinal lumbar muscles on the right as well as tenderness within the gluteal muscle on the right  Skin:    Capillary Refill: Capillary refill takes less than 2 seconds.  Neurological:     General: No focal deficit present.     Mental Status: He is alert and oriented to person, place, and time.     Cranial Nerves: No cranial nerve deficit.     Sensory: No sensory deficit.     Motor: No weakness.     Coordination: Coordination normal.     Comments: 5+ out of 5 strength throughout, normal  sensation    ED Results / Procedures / Treatments   Labs (all labs ordered are listed, but only abnormal results are displayed) Labs Reviewed - No data to display  EKG None  Radiology No results found.  Procedures Procedures    Medications Ordered in ED Medications  oxyCODONE (Oxy IR/ROXICODONE) immediate release tablet 5 mg (has no administration in time range)  lidocaine (LIDODERM) 5 % 1 patch (has no administration in time range)  acetaminophen (TYLENOL) tablet 1,000 mg (has no administration in time range)    ED Course/ Medical Decision Making/ A&P                           Medical Decision Making Risk OTC drugs. Prescription drug management.   Stephen Ward is here with low back pain.  Patient with unremarkable vitals.  No fever.  Pain mostly in the right lower back/right gluteal area with pain intermittently shooting down the right leg.  History of the same.  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  He has been on tenacity and and Toradol without much relief after being seen in urgent care several days ago.  He denies any loss of bowel or bladder.  No weakness or numbness.  No urinary retention.  Neurologically he is intact.  He has normal strength and sensation in his lower extremities.  He has good pulses in his lower extremities.  Suspect that patient has sciatica/muscle spasm.  Other differential diagnoses that were thought of but less likely due to history and physical are cauda equina, fracture.  I do not have suspicion for cord compression or other acute process.  Overall this seems consistent with sciatica/peripheral nerve irritation.  Possible that he has have some element of lumbar stenosis and we will have him follow-up with sports medicine.  Will prescribe Roxicodone for breakthrough pain.  He was given Roxicodone, lidocaine patch, Tylenol here.  Recommend that he take Tylenol, lidocaine patch, continue his Toradol up and as needed.   Discharged in good condition.  Understands return precautions.  This chart was dictated using voice recognition software.  Despite best efforts to proofread,  errors can occur which can change the documentation meaning.         Final Clinical Impression(s) / ED Diagnoses Final diagnoses:  Sciatica of right side    Rx / DC Orders ED Discharge Orders          Ordered    oxyCODONE (ROXICODONE) 5 MG immediate release tablet  Every 6 hours PRN        09/22/21 1233    methylPREDNISolone (MEDROL DOSEPAK) 4 MG TBPK tablet        09/22/21  [address]              Virgina Norfolk, DO 09/22/21 1237

## 2021-09-22 NOTE — ED Triage Notes (Signed)
Pt bib [facility] from home d/t chronic back pain.  Pt reports to EMS that he has muscle relaxer's and Toradol that are not helping.

## 2021-09-26 ENCOUNTER — Emergency Department (HOSPITAL_COMMUNITY): Payer: 59

## 2021-09-26 ENCOUNTER — Other Ambulatory Visit: Payer: Self-pay

## 2021-09-26 ENCOUNTER — Encounter (HOSPITAL_COMMUNITY): Payer: Self-pay

## 2021-09-26 ENCOUNTER — Emergency Department (HOSPITAL_COMMUNITY)
Admission: EM | Admit: 2021-09-26 | Discharge: 2021-09-26 | Disposition: A | Discharge status: Home / Self Care | Payer: 59 | Attending: Emergency Medicine | Admitting: Emergency Medicine

## 2021-09-26 DIAGNOSIS — Y990 Civilian activity done for income or pay: Secondary | ICD-10-CM | POA: Insufficient documentation

## 2021-09-26 DIAGNOSIS — M5431 Sciatica, right side: Secondary | ICD-10-CM

## 2021-09-26 DIAGNOSIS — M5117 Intervertebral disc disorders with radiculopathy, lumbosacral region: Secondary | ICD-10-CM | POA: Diagnosis not present

## 2021-09-26 DIAGNOSIS — M549 Dorsalgia, unspecified: Secondary | ICD-10-CM | POA: Diagnosis not present

## 2021-09-26 DIAGNOSIS — M5441 Lumbago with sciatica, right side: Secondary | ICD-10-CM | POA: Diagnosis not present

## 2021-09-26 MED ORDER — ONDANSETRON 8 MG PO TBDP
8.0000 mg | ORAL_TABLET | Freq: Once | ORAL | Status: AC
  Administered 2021-09-26: 8 mg via ORAL
  Filled 2021-09-26: qty 1

## 2021-09-26 MED ORDER — IBUPROFEN 800 MG PO TABS: 800.0000 mg | ORAL_TABLET | Freq: Three times a day (TID) | ORAL | 0 refills | Status: DC

## 2021-09-26 MED ORDER — HYDROMORPHONE HCL 1 MG/ML IJ SOLN
1.0000 mg | Freq: Once | INTRAMUSCULAR | Status: AC
  Administered 2021-09-26: 1 mg via INTRAMUSCULAR
  Filled 2021-09-26: qty 1

## 2021-09-26 NOTE — ED Provider Notes (Signed)
[facility] DEPT Provider Note   CSN: [id number] Arrival date & time: 09/26/21  1246     History  Chief Complaint  Patient presents with   Back Pain    Stephen Ward is a 41 y.o. male.   Back Pain  Patient with no contributing medical history presents with sciatic pain to the right leg.  Reports it started 2 weeks ago while he was at work, he works as a Biomedical scientist.  The pain is been pretty constant since then.  He was seen in urgent care 2 weeks ago and at the ED 3 days ago for it.  Reports she has had no relief other than with a shot of Toradol, he also took "20 of the Oxy this morning to take the edge off".  No urinary retention, saddle anesthesia, bilateral lower extremity weakness.  No previous surgeries to the back, no oncologic history, denies any history of IV drug use.  Denies any acute trauma to the back either.  Home Medications Prior to Admission medications   Medication Sig Start Date End Date Taking? Authorizing Provider  ibuprofen (ADVIL) 800 MG tablet Take 1 tablet (800 mg total) by mouth 3 (three) times daily. 09/26/21  Yes Sherrill Raring, PA-C  fluticasone (FLONASE) 50 MCG/ACT nasal spray Place 2 sprays into both nostrils daily. 09/21/20   Melynda Ripple, MD  ketorolac (TORADOL) 10 MG tablet Take 1 tablet (10 mg total) by mouth every 6 (six) hours as needed. 09/17/21   Hazel Sams, PA-C  lidocaine (XYLOCAINE) 2 % solution Use as directed 15 mLs in the mouth or throat as needed for mouth pain. 06/30/21   Hazel Sams, PA-C  methylPREDNISolone (MEDROL DOSEPAK) 4 MG TBPK tablet Follow package insert 09/22/21   Curatolo, Adam, DO  oxyCODONE (ROXICODONE) 5 MG immediate release tablet Take 1 tablet (5 mg total) by mouth every 6 (six) hours as needed for up to 10 doses for breakthrough pain. 09/22/21   Curatolo, Adam, DO  tiZANidine (ZANAFLEX) 4 MG tablet Take 1 tablet (4 mg total) by mouth every 8 (eight) hours as needed for muscle spasms. 09/17/21    Hazel Sams, PA-C      Allergies    Bee venom, Bee venom, and Bactrim [sulfamethoxazole-trimethoprim]    Review of Systems   Review of Systems  Musculoskeletal:  Positive for back pain.   Physical Exam Updated Vital Signs BP (!) 120/93    Pulse 72    Temp 98 F (36.7 C) (Oral)    Resp 18    Ht 5\' 7"  (1.702 m)    Wt 93 kg    SpO2 99%    BMI 32.11 kg/m  Physical Exam Vitals and nursing note reviewed. Exam conducted with a chaperone present.  Constitutional:      Appearance: Normal appearance.  HENT:     Head: Normocephalic.  Eyes:     Extraocular Movements: Extraocular movements intact.     Pupils: Pupils are equal, round, and reactive to light.     Comments: No nystagmus   Neck:     Comments: No midline cervical tenderness. No palpable deformities.  Cardiovascular:     Rate and Rhythm: Normal rate and regular rhythm.     Pulses: Normal pulses.     Comments: DP, PT, and radial pulses 2+ and symmetrical bilaterally Pulmonary:     Effort: Pulmonary effort is normal.     Breath sounds: Normal breath sounds.  Abdominal:  Tenderness: There is no right CVA tenderness or left CVA tenderness.  Musculoskeletal:        General: Tenderness present.     Cervical back: Normal range of motion. No rigidity or tenderness.     Comments: Positive reproduction of pain with raise of right lower extremity.  No midline tenderness to the lumbar thoracic spine.  Skin:    General: Skin is warm and dry.     Capillary Refill: Capillary refill takes less than 2 seconds.     Findings: No bruising or erythema.  Neurological:     Mental Status: He is alert and oriented to person, place, and time. Mental status is at baseline.     Comments: Patient is alert, oriented to personal, place and time with normal speech. Cranial nerves III-XII grossly in tact. Grip strength equal bilaterally LE strength equal bilaterally. Sensation to light touch in tact bilaterally.  Awkward gait, patient is able to  ambulate although he drags the right lower extremity  Psychiatric:        Mood and Affect: Mood normal.    ED Results / Procedures / Treatments   Labs (all labs ordered are listed, but only abnormal results are displayed) Labs Reviewed - No data to display  EKG None  Radiology CT Lumbar Spine Wo Contrast  Result Date: 09/26/2021 CLINICAL DATA:  Lumbar radiculopathy.  Infection suspected. EXAM: CT LUMBAR SPINE WITHOUT CONTRAST TECHNIQUE: Multidetector CT imaging of the lumbar spine was performed without intravenous contrast administration. Multiplanar CT image reconstructions were also generated. RADIATION DOSE REDUCTION: This exam was performed according to the departmental dose-optimization program which includes automated exposure control, adjustment of the mA and/or kV according to patient size and/or use of iterative reconstruction technique. COMPARISON:  CT abdomen and pelvis dated October 02, 2017 FINDINGS: Segmentation: 5 lumbar type vertebrae. Alignment: Mild retrolisthesis of L5. Straightening of the lumbar spine. Vertebrae: No acute fracture or focal pathologic process. Paraspinal and other soft tissues: Negative. Other: None Disc spaces: Mild multilevel degenerative disc disease prominent at L5-S1. T12-L1: No significant finding. L1-L2: No significant findings. L2-L3: No significant findings. L3-L4: Mild circumferential disc bulge with mild narrowing left lateral recess. No significant neural foraminal narrowing. L4-L5: Circumferential disc bulge and facet joint arthropathy. Mild narrowing of spinal canal. No significant neural foraminal narrowing. L5-S1: Disc osteophyte complex with moderate spinal canal narrowing. Moderate facet joint arthropathy. Mild-to-moderate bilateral neural foraminal narrowing. IMPRESSION: 1.  No evidence of fracture. 2. Degenerate disc disease prominent at L5-S1 with mild-to-moderate bilateral neural foraminal narrowing and marked associated facet joint  arthropathy. Paraspinal soft tissues are within normal limits. Electronically Signed   By: Keane Police D.O.   On: 09/26/2021 14:24    Procedures Procedures    Medications Ordered in ED Medications  HYDROmorphone (DILAUDID) injection 1 mg (1 mg Intramuscular Given 09/26/21 1449)  ondansetron (ZOFRAN-ODT) disintegrating tablet 8 mg (8 mg Oral Given 09/26/21 1448)    ED Course/ Medical Decision Making/ A&P                           Medical Decision Making Amount and/or Complexity of Data Reviewed Radiology: ordered.  Risk Prescription drug management.   This patient presents to the ED for concern of low back pain, this involves an extensive number of treatment options, and is a complaint that carries with it a high risk of complications and morbidity.  The differential diagnosis includes sciatica, epidural abscess, cauda equina, other.  Given physical exam I have a high suspicion this is sciatica, no IV drug use epidural abscess highly unlikely.   Co morbidities that complicate the patient evaluation   Additional history obtained: -Additional history obtained from chart review.  I personally reviewed his previous ED visits. -External records from outside source obtained and reviewed including: Chart review including previous notes, labs, imaging, consultation notes    Imaging Studies ordered: -I ordered imaging studies including CT lumbar -I independently visualized and interpreted imaging which showed no evidence of acute pathology, there is narrowing consistent with his presentation of sciatic symptoms. -I agree with the radiologist interpretation   Medicines ordered and prescription drug management: -I ordered medication including Dilaudid for pain -Reevaluation of the patient after these medicines showed that the patient resolved -I have reviewed the patients home medicines and have made adjustments as needed   ED Course: Patient is a 41 year old male present with back  pain.  No focal deficits on neuro exam, no recent trauma.  Physical exam consistent sciatica.  Given this is his third ED visit in 2 weeks proceed with CT lumbar to better evaluate.  Negative for any acute pathology, consistent with lumbar stenosis concerning for sciatica.  Will give patient ibuprofen, encouraged outpatient follow-up with orthopedic surgery.  Do not feel he needs any additional work-up in the ED.  Doubt cauda equina, epidural abscess, emergent pathology.   Reevaluation: After the interventions noted above, I reevaluated the patient and found that they have :resolved   Dispostion: D/C         Final Clinical Impression(s) / ED Diagnoses Final diagnoses:  Sciatica of right side    Rx / DC Orders ED Discharge Orders          Ordered    ibuprofen (ADVIL) 800 MG tablet  3 times daily        09/26/21 1437              Sherrill Raring, PA-C 09/26/21 2142    Valarie Merino, MD 09/29/21 [phone]

## 2021-09-26 NOTE — ED Triage Notes (Signed)
Patient c/o right buttock pain that radiates into the right leg. Patient states he is unable to stand and has to have help putting his clothes on. Patient states pain meds prescribed are not helping.  Patient states he has been out of work and really needs to return

## 2021-09-26 NOTE — Discharge Instructions (Signed)
Schedule an appointment with Dr. Susa Simmonds for follow-up.  Call them today to set up an appointment for later this week.  Take ibuprofen 3 times daily, that is every 8 hours for the next week.

## 2021-09-29 ENCOUNTER — Ambulatory Visit: Payer: 59 | Admitting: Family Medicine

## 2021-11-21 ENCOUNTER — Other Ambulatory Visit: Payer: Self-pay

## 2021-11-21 ENCOUNTER — Encounter (HOSPITAL_COMMUNITY): Payer: Self-pay | Admitting: *Deleted

## 2021-11-21 ENCOUNTER — Ambulatory Visit (HOSPITAL_COMMUNITY)
Admission: EM | Admit: 2021-11-21 | Discharge: 2021-11-21 | Disposition: A | Discharge status: Home / Self Care | Payer: 59 | Attending: Family Medicine | Admitting: Family Medicine

## 2021-11-21 DIAGNOSIS — K122 Cellulitis and abscess of mouth: Secondary | ICD-10-CM

## 2021-11-21 MED ORDER — KETOROLAC TROMETHAMINE 30 MG/ML IJ SOLN
INTRAMUSCULAR | Status: AC
  Filled 2021-11-21: qty 1

## 2021-11-21 MED ORDER — AMOXICILLIN-POT CLAVULANATE 875-125 MG PO TABS: 1.0000 | ORAL_TABLET | Freq: Two times a day (BID) | ORAL | 0 refills | Status: AC

## 2021-11-21 MED ORDER — KETOROLAC TROMETHAMINE 30 MG/ML IJ SOLN
30.0000 mg | Freq: Once | INTRAMUSCULAR | Status: AC
  Administered 2021-11-21: 30 mg via INTRAMUSCULAR

## 2021-11-21 MED ORDER — IBUPROFEN 800 MG PO TABS: 800.0000 mg | ORAL_TABLET | Freq: Three times a day (TID) | ORAL | 0 refills | Status: DC | PRN

## 2021-11-21 NOTE — Discharge Instructions (Addendum)
You have been given a shot of 30 mg of Toradol today ? ?Take amoxicillin-clavulanate 875 mg--1 tab twice daily with food for 7 days  ? ?Take ibuprofen 800 mg--1 tab every 8 hours as needed for pain.  ?

## 2021-11-21 NOTE — ED Provider Notes (Addendum)
?[facility] ? ? ? ?CSN: [id number] ?Arrival date & time: 11/21/21  1147 ? ? ?  ? ?History   ?Chief Complaint ?Chief Complaint  ?Patient presents with  ? Dental Pain  ? ? ?HPI ?Stephen Ward is a 41 y.o. male.  ? ? ?Dental Pain ?Here for left-sided mouth and tooth pain for the last 3 days.  Its been intense enough that he has had a hard time sleeping.  No fever or chills. ? ?Past Medical History:  ?Diagnosis Date  ? Back pain   ? Headache   ? ? ?Patient Active Problem List  ? Diagnosis Date Noted  ? Renal vein laceration, right, initial encounter 09/02/2017  ? ? ?History reviewed. No pertinent surgical history. ? ? ? ? ?Home Medications   ? ?Prior to Admission medications   ?Medication Sig Start Date End Date Taking? Authorizing Provider  ?amoxicillin-clavulanate (AUGMENTIN) 875-125 MG tablet Take 1 tablet by mouth 2 (two) times daily for 7 days. 11/21/21 11/28/21 Yes Zenia Resides, MD  ?ibuprofen (ADVIL) 800 MG tablet Take 1 tablet (800 mg total) by mouth every 8 (eight) hours as needed (pain). 11/21/21  Yes Zenia Resides, MD  ?fluticasone (FLONASE) 50 MCG/ACT nasal spray Place 2 sprays into both nostrils daily. 09/21/20   Domenick Gong, MD  ? ? ?Family History ?Family History  ?Problem Relation Age of Onset  ? Seizures Mother   ? Healthy Father   ? ? ?Social History ?Social History  ? ?Tobacco Use  ? Smoking status: Every Day  ?  Packs/day: 0.50  ?  Types: Cigarettes  ? Smokeless tobacco: Never  ?Vaping Use  ? Vaping Use: Never used  ?Substance Use Topics  ? Alcohol use: No  ? Drug use: No  ? ? ? ?Allergies   ?Bee venom, Bee venom, and Bactrim [sulfamethoxazole-trimethoprim] ? ? ?Review of Systems ?Review of Systems ? ? ?Physical Exam ?Triage Vital Signs ?ED Triage Vitals  ?Enc Vitals Group  ?   BP 11/21/21 1538 121/64  ?   Pulse Rate 11/21/21 1538 62  ?   Resp 11/21/21 1538 20  ?   Temp 11/21/21 1538 98.8 ?F (37.1 ?C)  ?   Temp src --   ?   SpO2 11/21/21 1538 97 %  ?   Weight --   ?   Height --    ?   Head Circumference --   ?   Peak Flow --   ?   Pain Score 11/21/21 1536 10  ?   Pain Loc --   ?   Pain Edu? --   ?   Excl. in GC? --   ? ?No data found. ? ?Updated Vital Signs ?BP 121/64   Pulse 62   Temp 98.8 ?F (37.1 ?C)   Resp 20   SpO2 97%  ? ?Visual Acuity ?Right Eye Distance:   ?Left Eye Distance:   ?Bilateral Distance:   ? ?Right Eye Near:   ?Left Eye Near:    ?Bilateral Near:    ? ?Physical Exam ?Vitals and nursing note reviewed.  ?Constitutional:   ?   General: He is not in acute distress. ?   Appearance: He is not toxic-appearing.  ?HENT:  ?   Mouth/Throat:  ?   Mouth: Mucous membranes are moist.  ?   Comments: There is little swelling in the buccal mucosa on the left ?Eyes:  ?   Extraocular Movements: Extraocular movements intact.  ?   Pupils: Pupils  are equal, round, and reactive to light.  ?Cardiovascular:  ?   Rate and Rhythm: Normal rate and regular rhythm.  ?Musculoskeletal:  ?   Cervical back: Neck supple.  ?Lymphadenopathy:  ?   Cervical: No cervical adenopathy.  ?Neurological:  ?   Mental Status: He is alert and oriented to person, place, and time.  ?Psychiatric:     ?   Behavior: Behavior normal.  ? ? ? ?UC Treatments / Results  ?Labs ?(all labs ordered are listed, but only abnormal results are displayed) ?Labs Reviewed - No data to display ? ?EKG ? ? ?Radiology ?No results found. ? ?Procedures ?Procedures (including critical care time) ? ?Medications Ordered in UC ?Medications  ?ketorolac (TORADOL) 30 MG/ML injection 30 mg (has no administration in time range)  ? ? ?Initial Impression / Assessment and Plan / UC Course  ?I have reviewed the triage vital signs and the nursing notes. ? ?Pertinent labs & imaging results that were available during my care of the patient were reviewed by me and considered in my medical decision making (see chart for details). ? ?  ? ?We will treat with Augmentin and ibuprofen as needed ?Final Clinical Impressions(s) / UC Diagnoses  ? ?Final diagnoses:  ?Oral  infection  ? ? ? ?Discharge Instructions   ? ?  ?You have been given a shot of 30 mg of Toradol today ? ?Take amoxicillin-clavulanate 875 mg--1 tab twice daily with food for 7 days  ? ?Take ibuprofen 800 mg--1 tab every 8 hours as needed for pain.  ? ? ? ? ?ED Prescriptions   ? ? Medication Sig Dispense Auth. Provider  ? amoxicillin-clavulanate (AUGMENTIN) 875-125 MG tablet Take 1 tablet by mouth 2 (two) times daily for 7 days. 14 tablet Wayde Gopaul, Janace Aris, MD  ? ibuprofen (ADVIL) 800 MG tablet Take 1 tablet (800 mg total) by mouth every 8 (eight) hours as needed (pain). 21 tablet Zenia Resides, MD  ? ?  ? ?I have reviewed the PDMP during this encounter. ?  ?Zenia Resides, MD ?11/21/21 1626 ? ?  ?Zenia Resides, MD ?11/21/21 1626 ? ?

## 2021-11-21 NOTE — ED Triage Notes (Signed)
PT reports dental pain for 3 days. ?

## 2022-04-22 ENCOUNTER — Encounter (HOSPITAL_COMMUNITY): Payer: Self-pay | Admitting: *Deleted

## 2022-04-22 ENCOUNTER — Other Ambulatory Visit: Payer: Self-pay

## 2022-04-22 ENCOUNTER — Ambulatory Visit (HOSPITAL_COMMUNITY)
Admission: EM | Admit: 2022-04-22 | Discharge: 2022-04-22 | Disposition: A | Discharge status: Home / Self Care | Payer: Commercial Managed Care - HMO | Attending: Physician Assistant | Admitting: Physician Assistant

## 2022-04-22 DIAGNOSIS — R110 Nausea: Secondary | ICD-10-CM | POA: Insufficient documentation

## 2022-04-22 DIAGNOSIS — Z1152 Encounter for screening for COVID-19: Secondary | ICD-10-CM | POA: Diagnosis not present

## 2022-04-22 DIAGNOSIS — J069 Acute upper respiratory infection, unspecified: Secondary | ICD-10-CM | POA: Insufficient documentation

## 2022-04-22 MED ORDER — ONDANSETRON 4 MG PO TBDP
ORAL_TABLET | ORAL | Status: AC
  Filled 2022-04-22: qty 1

## 2022-04-22 MED ORDER — ONDANSETRON 4 MG PO TBDP
4.0000 mg | ORAL_TABLET | Freq: Once | ORAL | Status: AC
  Administered 2022-04-22: 4 mg via ORAL

## 2022-04-22 MED ORDER — DM-GUAIFENESIN ER 30-600 MG PO TB12: 1.0000 | ORAL_TABLET | Freq: Two times a day (BID) | ORAL | 0 refills | Status: DC

## 2022-04-22 MED ORDER — ONDANSETRON HCL 4 MG PO TABS: 4.0000 mg | ORAL_TABLET | Freq: Three times a day (TID) | ORAL | 0 refills | Status: DC | PRN

## 2022-04-22 NOTE — ED Triage Notes (Signed)
Pt reports for last 3 days he has not been able to smell or taste food. Pt also reports body aches for 3 days.

## 2022-04-22 NOTE — ED Provider Notes (Signed)
[facility]    CSN: [id number] Arrival date & time: 04/22/22  1114      History   Chief Complaint Chief Complaint  Patient presents with   loss smell   loss of taste   Generalized Body Aches    HPI Stephen Ward is a 41 y.o. male.   41 year old male presents with loss of taste, smell, and nausea.  Patient relates for the past 3 days he has been having progressive symptoms of loss of sense of taste, smell, mild upper respiratory congestion with clear rhinitis and intermittent mild sore throat.  He also indicates that he has been having some chest congestion with intermittent cough.  Patient relates that he has been having mild fatigue, body aches and pain.  He also indicates at the same time he has had stomach cramping with nausea for the past 4 to 5 days with associated loose stools and intermittent diarrhea.  Patient indicates that he has not running fever, he has been vaccinated against COVID with at least 1 vaccine.  He relates that he does not recall being exposed to any family or friends that have been sick however he does ride the bus to and from work, and he works in [employer].     Past Medical History:  Diagnosis Date   Back pain    Headache     Patient Active Problem List   Diagnosis Date Noted   Renal vein laceration, right, initial encounter 09/02/2017    History reviewed. No pertinent surgical history.     Home Medications    Prior to Admission medications   Medication Sig Start Date End Date Taking? Authorizing Provider  dextromethorphan-guaiFENesin (MUCINEX DM) 30-600 MG 12hr tablet Take 1 tablet by mouth 2 (two) times daily. 04/22/22  Yes Ellsworth Lennox, PA-C  ondansetron (ZOFRAN) 4 MG tablet Take 1 tablet (4 mg total) by mouth every 8 (eight) hours as needed for nausea or vomiting. 04/22/22  Yes Ellsworth Lennox, PA-C  fluticasone [facility]) 50 MCG/ACT nasal spray Place 2 sprays into both nostrils daily. 09/21/20   Domenick Gong, MD   ibuprofen (ADVIL) 800 MG tablet Take 1 tablet (800 mg total) by mouth every 8 (eight) hours as needed (pain). 11/21/21   Zenia Resides, MD    Family History Family History  Problem Relation Age of Onset   Seizures Mother    Healthy Father     Social History Social History   Tobacco Use   Smoking status: Every Day    Packs/day: 0.50    Types: Cigarettes   Smokeless tobacco: Never  Vaping Use   Vaping Use: Never used  Substance Use Topics   Alcohol use: No   Drug use: No     Allergies   Bee venom, Bee venom, and Bactrim [sulfamethoxazole-trimethoprim]   Review of Systems Review of Systems  Constitutional:  Positive for appetite change (loss of smell and taste) and fatigue.  HENT:  Positive for postnasal drip and rhinorrhea.   Respiratory:  Positive for cough.   Gastrointestinal:  Positive for diarrhea and nausea.     Physical Exam Triage Vital Signs ED Triage Vitals  Enc Vitals Group     BP 04/22/22 1137 105/72     Pulse Rate 04/22/22 1137 70     Resp 04/22/22 1137 18     Temp 04/22/22 1137 98.2 F (36.8 C)     Temp src --      SpO2 04/22/22 1137 97 %  Weight --      Height --      Head Circumference --      Peak Flow --      Pain Score 04/22/22 1135 9     Pain Loc --      Pain Edu? --      Excl. in [place]? --    No data found.  Updated Vital Signs BP 105/72   Pulse 70   Temp 98.2 F (36.8 C)   Resp 18   SpO2 97%   Visual Acuity Right Eye Distance:   Left Eye Distance:   Bilateral Distance:    Right Eye Near:   Left Eye Near:    Bilateral Near:     Physical Exam Constitutional:      Appearance: Normal appearance.  HENT:     Right Ear: Tympanic membrane and ear canal normal.     Left Ear: Tympanic membrane and ear canal normal.     Mouth/Throat:     Mouth: Mucous membranes are moist.     Pharynx: Oropharynx is clear. No pharyngeal swelling or posterior oropharyngeal erythema.  Cardiovascular:     Rate and Rhythm: Normal rate and  regular rhythm.     Heart sounds: Normal heart sounds.  Pulmonary:     Effort: Pulmonary effort is normal.     Breath sounds: Normal breath sounds and air entry. No wheezing or rhonchi.  Abdominal:     General: Abdomen is flat. Bowel sounds are normal.     Palpations: Abdomen is soft.     Tenderness: There is abdominal tenderness. There is no guarding or rebound.  Lymphadenopathy:     Cervical: No cervical adenopathy.  Neurological:     Mental Status: He is alert.      UC Treatments / Results  Labs (all labs ordered are listed, but only abnormal results are displayed) Labs Reviewed  SARS CORONAVIRUS 2 (TAT 6-24 HRS)    EKG   Radiology No results found.  Procedures Procedures (including critical care time)  Medications Ordered in UC Medications  ondansetron (ZOFRAN-ODT) disintegrating tablet 4 mg (has no administration in time range)    Initial Impression / Assessment and Plan / UC Course  I have reviewed the triage vital signs and the nursing notes.  Pertinent labs & imaging results that were available during my care of the patient were reviewed by me and considered in my medical decision making (see chart for details).    Plan: 1.  COVID test is pending. 2.  Advised to take the Zofran every 6 hours as needed for stomach upset and cramping. 3.  Advised to follow-up with PCP or return to urgent care if symptoms fail to improve. 4.  Advised to take Mucinex DM for chest congestion and cough. Final Clinical Impressions(s) / UC Diagnoses   Final diagnoses:  Viral upper respiratory tract infection  Encounter for screening for COVID-19  Nausea without vomiting     Discharge Instructions      Advised to continue with clear liquids and a bland diet for the next 24 to 48 hours. Advised to take Zofran every 6 hours to help control the stomach cramping and the loose stools. COVID test should be completed in 24 hours or less, if you did do not get a call from this  office that indicates the test is negative, she can go on MyChart to review the results when it post in 24 hours or less. Advised to follow-up with PCP or  return to urgent care if symptoms fail to improve    ED Prescriptions     Medication Sig Dispense Auth. Provider   ondansetron (ZOFRAN) 4 MG tablet Take 1 tablet (4 mg total) by mouth every 8 (eight) hours as needed for nausea or vomiting. 20 tablet Ellsworth Lennox, PA-C   dextromethorphan-guaiFENesin [facility] DM) 30-600 MG 12hr tablet Take 1 tablet by mouth 2 (two) times daily. 14 tablet Ellsworth Lennox, PA-C      PDMP not reviewed this encounter.   Avrey, Hyser, PA-C 04/22/22 1212

## 2022-04-22 NOTE — Discharge Instructions (Signed)
Advised to continue with clear liquids and a bland diet for the next 24 to 48 hours. Advised to take Zofran every 6 hours to help control the stomach cramping and the loose stools. COVID test should be completed in 24 hours or less, if you did do not get a call from this office that indicates the test is negative, she can go on MyChart to review the results when it post in 24 hours or less. Advised to follow-up with PCP or return to urgent care if symptoms fail to improve

## 2022-04-23 LAB — SARS CORONAVIRUS 2 (TAT 6-24 HRS): SARS Coronavirus 2: NEGATIVE

## 2022-07-20 ENCOUNTER — Ambulatory Visit (HOSPITAL_COMMUNITY)
Admission: EM | Admit: 2022-07-20 | Discharge: 2022-07-20 | Disposition: A | Discharge status: Home / Self Care | Payer: Commercial Managed Care - HMO | Attending: Internal Medicine | Admitting: Internal Medicine

## 2022-07-20 ENCOUNTER — Encounter (HOSPITAL_COMMUNITY): Payer: Self-pay | Admitting: Emergency Medicine

## 2022-07-20 ENCOUNTER — Other Ambulatory Visit: Payer: Self-pay

## 2022-07-20 DIAGNOSIS — K047 Periapical abscess without sinus: Secondary | ICD-10-CM | POA: Diagnosis not present

## 2022-07-20 MED ORDER — AMOXICILLIN-POT CLAVULANATE 875-125 MG PO TABS: 1.0000 | ORAL_TABLET | Freq: Two times a day (BID) | ORAL | 0 refills | Status: AC

## 2022-07-20 MED ORDER — IBUPROFEN 800 MG PO TABS: 800.0000 mg | ORAL_TABLET | Freq: Three times a day (TID) | ORAL | 0 refills | Status: DC | PRN

## 2022-07-20 NOTE — Discharge Instructions (Addendum)
I am prescribing you Augmentin twice daily for 7 days for your dental infection.  Take antibiotic with food to prevent GI upset.  Take ibuprofen 800 mg every 8 hours as needed for pain.  As discussed, you need to follow-up with a dentist for possible tooth extraction as it is not ideal for you to be on antibiotics frequently.  Provided you a referral.  Please return for any worsening symptoms, fever or chills.

## 2022-07-20 NOTE — ED Triage Notes (Signed)
Upper and lower teeth on the left side of mouth are hurting.  The last 3 days ahas been the most painful

## 2022-07-20 NOTE — ED Provider Notes (Signed)
[facility]    CSN: [id number] Arrival date & time: 07/20/22  1353      History   Chief Complaint Chief Complaint  Patient presents with   Dental Pain    HPI Stephen Ward is a 41 y.o. male with history of tobacco abuse presents to urgent care today with complaints of dental pain.  Patient with recurrent dental infections.  Current episode occurring for 3 days.  Afraid to eat due to discomfort.  He denies any recent fever or chills.  Patient admits to continued tobacco abuse despite pain.  Requesting referral to dentist for tooth extraction.   Past Medical History:  Diagnosis Date   Back pain    Headache     Patient Active Problem List   Diagnosis Date Noted   Renal vein laceration, right, initial encounter 09/02/2017    History reviewed. No pertinent surgical history.     Home Medications    Prior to Admission medications   Medication Sig Start Date End Date Taking? Authorizing Provider  amoxicillin-clavulanate (AUGMENTIN) 875-125 MG tablet Take 1 tablet by mouth 2 (two) times daily for 7 days. 07/20/22 07/27/22 Yes Rolla Etienne, NP  ibuprofen (ADVIL) 800 MG tablet Take 1 tablet (800 mg total) by mouth every 8 (eight) hours as needed. 07/20/22  Yes Rolla Etienne, NP  dextromethorphan-guaiFENesin [facility] DM) 30-600 MG 12hr tablet Take 1 tablet by mouth 2 (two) times daily. 04/22/22   Ellsworth Lennox, PA-C  fluticasone [facility]) 50 MCG/ACT nasal spray Place 2 sprays into both nostrils daily. 09/21/20   Domenick Gong, MD  ibuprofen (ADVIL) 800 MG tablet Take 1 tablet (800 mg total) by mouth every 8 (eight) hours as needed (pain). 11/21/21   Zenia Resides, MD  ondansetron (ZOFRAN) 4 MG tablet Take 1 tablet (4 mg total) by mouth every 8 (eight) hours as needed for nausea or vomiting. 04/22/22   Ellsworth Lennox, PA-C    Family History Family History  Problem Relation Age of Onset   Seizures Mother    Healthy Father     Social History Social History    Tobacco Use   Smoking status: Every Day    Packs/day: 0.50    Types: Cigarettes   Smokeless tobacco: Never  Vaping Use   Vaping Use: Never used  Substance Use Topics   Alcohol use: No   Drug use: No     Allergies   Bee venom, Bee venom, and Bactrim [sulfamethoxazole-trimethoprim]   Review of Systems As stated in HPI otherwise negative   Physical Exam Triage Vital Signs ED Triage Vitals  Enc Vitals Group     BP 07/20/22 1541 104/73     Pulse Rate 07/20/22 1541 77     Resp 07/20/22 1541 (!) 22     Temp 07/20/22 1541 97.6 F (36.4 C)     Temp Source 07/20/22 1541 Oral     SpO2 07/20/22 1541 96 %     Weight --      Height --      Head Circumference --      Peak Flow --      Pain Score 07/20/22 1539 8     Pain Loc --      Pain Edu? --      Excl. in GC? --    No data found.  Updated Vital Signs BP 104/73 (BP Location: Right Arm) Comment (BP Location): large cuff  Pulse 77   Temp 97.6 F (36.4 C) (Oral)  Resp (!) 22   SpO2 96%   Visual Acuity Right Eye Distance:   Left Eye Distance:   Bilateral Distance:    Right Eye Near:   Left Eye Near:    Bilateral Near:     Physical Exam Constitutional:      General: He is not in acute distress.    Appearance: Normal appearance. He is not ill-appearing or toxic-appearing.  HENT:     Head:     Comments: Patient with mild swelling to left buccal area.  Cracked tooth on upper right side.    Mouth/Throat:     Mouth: Mucous membranes are moist.     Pharynx: No oropharyngeal exudate.  Musculoskeletal:     Cervical back: Normal range of motion and neck supple. No tenderness.  Lymphadenopathy:     Cervical: No cervical adenopathy.  Skin:    General: Skin is warm and dry.  Neurological:     Mental Status: He is alert.      UC Treatments / Results  Labs (all labs ordered are listed, but only abnormal results are displayed) Labs Reviewed - No data to display  EKG   Radiology No results  found.  Procedures Procedures (including critical care time)  Medications Ordered in UC Medications - No data to display  Initial Impression / Assessment and Plan / UC Course  I have reviewed the triage vital signs and the nursing notes.  Pertinent labs & imaging results that were available during my care of the patient were reviewed by me and considered in my medical decision making (see chart for details).  Dental infection -Recurrent.  No evidence of abscess.  Long discussion regarding risk of frequent antibiotic treatment.  Patient agreeable to follow-up with dentist for likely tooth extraction. -Augmentin twice daily x 7 days.  Ibuprofen 800 mg every 8 8 hours as needed -Follow-up for any persistent or worsening symptoms  Reviewed expections re: course of current medical issues. Questions answered. Outlined signs and symptoms indicating need for more acute intervention. Pt verbalized understanding. AVS given  Final Clinical Impressions(s) / UC Diagnoses   Final diagnoses:  Dental infection     Discharge Instructions      I am prescribing you Augmentin twice daily for 7 days for your dental infection.  Take antibiotic with food to prevent GI upset.  Take ibuprofen 800 mg every 8 hours as needed for pain.  As discussed, you need to follow-up with a dentist for possible tooth extraction as it is not ideal for you to be on antibiotics frequently.  Provided you a referral.  Please return for any worsening symptoms, fever or chills.     ED Prescriptions     Medication Sig Dispense Auth. Provider   amoxicillin-clavulanate (AUGMENTIN) 875-125 MG tablet Take 1 tablet by mouth 2 (two) times daily for 7 days. 14 tablet Rudolpho Sevin, NP   ibuprofen (ADVIL) 800 MG tablet Take 1 tablet (800 mg total) by mouth every 8 (eight) hours as needed. 30 tablet Rudolpho Sevin, NP      PDMP not reviewed this encounter.   Rudolpho Sevin, NP 07/20/22 [phone]

## 2022-09-20 ENCOUNTER — Ambulatory Visit (HOSPITAL_COMMUNITY)
Admission: EM | Admit: 2022-09-20 | Discharge: 2022-09-20 | Disposition: A | Discharge status: Home / Self Care | Payer: Commercial Managed Care - HMO | Attending: Family Medicine | Admitting: Family Medicine

## 2022-09-20 ENCOUNTER — Encounter (HOSPITAL_COMMUNITY): Payer: Self-pay

## 2022-09-20 DIAGNOSIS — M5432 Sciatica, left side: Secondary | ICD-10-CM

## 2022-09-20 MED ORDER — METHYLPREDNISOLONE SODIUM SUCC 125 MG IJ SOLR
INTRAMUSCULAR | Status: AC
  Filled 2022-09-20: qty 2

## 2022-09-20 MED ORDER — KETOROLAC TROMETHAMINE 30 MG/ML IJ SOLN
30.0000 mg | Freq: Once | INTRAMUSCULAR | Status: AC
  Administered 2022-09-20: 30 mg via INTRAMUSCULAR

## 2022-09-20 MED ORDER — PREDNISONE 20 MG PO TABS: 40.0000 mg | ORAL_TABLET | Freq: Every day | ORAL | 0 refills | Status: AC

## 2022-09-20 MED ORDER — KETOROLAC TROMETHAMINE 30 MG/ML IJ SOLN
INTRAMUSCULAR | Status: AC
  Filled 2022-09-20: qty 1

## 2022-09-20 MED ORDER — METHYLPREDNISOLONE SODIUM SUCC 125 MG IJ SOLR
80.0000 mg | Freq: Once | INTRAMUSCULAR | Status: AC
  Administered 2022-09-20: 80 mg via INTRAMUSCULAR

## 2022-09-20 NOTE — ED Triage Notes (Signed)
Pt c/o lt hip pain radiating down leg/foot/toe for over a week. States hx of sciatica nerve pain. States taking ibuprofen with little pain. Denies injury.

## 2022-09-20 NOTE — Discharge Instructions (Addendum)
You were seen today for sciatica of the left leg.  You were  given a shot of toradol for pain, as well as a steroid.  I have sent a script for prednisone to your pharmacy.  Please start this tomorrow morning.  You may continue tylenol or motrin for pain as well.  Follow up with your primary care provider if not improving as expected.

## 2022-09-20 NOTE — ED Provider Notes (Signed)
[facility]    CSN: [id number] Arrival date & time: 09/20/22  1020      History   Chief Complaint Chief Complaint  Patient presents with   Leg Pain    HPI Stephen Ward is a 42 y.o. male.   Patient is here for left back, hip, leg pain.  Pain started about 10 days ago.  7/10 out of pain at this time.  He took his last 2 prednisone and motrin this morning.  Pain radiates from the left hip down to the toes.  Cramping in the toes.  + numbness/tingling.  He has a h/o sciatica, and given prednisone in the past.  He had two left over which he took today.        Past Medical History:  Diagnosis Date   Back pain    Headache     Patient Active Problem List   Diagnosis Date Noted   Renal vein laceration, right, initial encounter 09/02/2017    History reviewed. No pertinent surgical history.     Home Medications    Prior to Admission medications   Medication Sig Start Date End Date Taking? Authorizing Provider  ibuprofen (ADVIL) 800 MG tablet Take 1 tablet (800 mg total) by mouth every 8 (eight) hours as needed. 07/20/22   Rudolpho Sevin, NP    Family History Family History  Problem Relation Age of Onset   Seizures Mother    Healthy Father     Social History Social History   Tobacco Use   Smoking status: Every Day    Packs/day: 0.50    Types: Cigarettes   Smokeless tobacco: Never  Vaping Use   Vaping Use: Never used  Substance Use Topics   Alcohol use: No   Drug use: No     Allergies   Bee venom, Bee venom, and Bactrim [sulfamethoxazole-trimethoprim]   Review of Systems Review of Systems  Constitutional: Negative.   HENT: Negative.    Respiratory: Negative.    Cardiovascular: Negative.   Gastrointestinal: Negative.   Musculoskeletal:  Positive for gait problem.  Skin: Negative.      Physical Exam Triage Vital Signs ED Triage Vitals [09/20/22 1241]  Enc Vitals Group     BP 108/60     Pulse Rate 93     Resp 18     Temp  97.8 F (36.6 C)     Temp Source Oral     SpO2 97 %     Weight      Height      Head Circumference      Peak Flow      Pain Score 7     Pain Loc      Pain Edu?      Excl. in [place]?    No data found.  Updated Vital Signs BP 108/60 (BP Location: Left Arm)   Pulse 93   Temp 97.8 F (36.6 C) (Oral)   Resp 18   SpO2 97%   Visual Acuity Right Eye Distance:   Left Eye Distance:   Bilateral Distance:    Right Eye Near:   Left Eye Near:    Bilateral Near:     Physical Exam Constitutional:      Appearance: Normal appearance.  Cardiovascular:     Rate and Rhythm: Normal rate.  Pulmonary:     Effort: Pulmonary effort is normal.  Musculoskeletal:     Comments: No TTP to the lumbar spine;  no TTP to the left  SI joint;  + TTP to the lateral left hip;  normal strength to the LE bilaterally  Skin:    General: Skin is warm.  Neurological:     General: No focal deficit present.     Mental Status: He is alert.     Motor: No weakness.  Psychiatric:        Mood and Affect: Mood normal.      UC Treatments / Results  Labs (all labs ordered are listed, but only abnormal results are displayed) Labs Reviewed - No data to display  EKG   Radiology No results found.  Procedures Procedures (including critical care time)  Medications Ordered in UC Medications  ketorolac (TORADOL) 30 MG/ML injection 30 mg (has no administration in time range)  methylPREDNISolone sodium succinate (SOLU-MEDROL) 125 mg/2 mL injection 80 mg (has no administration in time range)    Initial Impression / Assessment and Plan / UC Course  I have reviewed the triage vital signs and the nursing notes.  Pertinent labs & imaging results that were available during my care of the patient were reviewed by me and considered in my medical decision making (see chart for details).    Final Clinical Impressions(s) / UC Diagnoses   Final diagnoses:  Sciatica of left side     Discharge Instructions       You were seen today for sciatica of the left leg.  You were  given a shot of toradol for pain, as well as a steroid.  I have sent a script for prednisone to your pharmacy.  Please start this tomorrow morning.  You may continue tylenol or motrin for pain as well.  Follow up with your primary care provider if not improving as expected.     ED Prescriptions     Medication Sig Dispense Auth. Provider   predniSONE (DELTASONE) 20 MG tablet Take 2 tablets (40 mg total) by mouth daily for 5 days. 10 tablet Rondel Oh, MD      PDMP not reviewed this encounter.   Rondel Oh, MD 09/20/22 [phone]

## 2022-12-04 ENCOUNTER — Encounter: Payer: Self-pay | Admitting: *Deleted

## 2022-12-16 ENCOUNTER — Ambulatory Visit (HOSPITAL_COMMUNITY)
Admission: EM | Admit: 2022-12-16 | Discharge: 2022-12-16 | Disposition: A | Discharge status: Home / Self Care | Payer: Self-pay | Attending: Emergency Medicine | Admitting: Emergency Medicine

## 2022-12-16 ENCOUNTER — Encounter (HOSPITAL_COMMUNITY): Payer: Self-pay | Admitting: Emergency Medicine

## 2022-12-16 DIAGNOSIS — M5432 Sciatica, left side: Secondary | ICD-10-CM

## 2022-12-16 MED ORDER — KETOROLAC TROMETHAMINE 60 MG/2ML IM SOLN
INTRAMUSCULAR | Status: AC
  Filled 2022-12-16: qty 2

## 2022-12-16 MED ORDER — METHYLPREDNISOLONE SODIUM SUCC 125 MG IJ SOLR
INTRAMUSCULAR | Status: AC
  Filled 2022-12-16: qty 2

## 2022-12-16 MED ORDER — PREDNISONE 20 MG PO TABS: 40.0000 mg | ORAL_TABLET | Freq: Every day | ORAL | 0 refills | Status: AC

## 2022-12-16 MED ORDER — METHYLPREDNISOLONE SODIUM SUCC 125 MG IJ SOLR
80.0000 mg | Freq: Once | INTRAMUSCULAR | Status: AC
  Administered 2022-12-16: 80 mg via INTRAMUSCULAR

## 2022-12-16 MED ORDER — KETOROLAC TROMETHAMINE 60 MG/2ML IM SOLN
60.0000 mg | Freq: Once | INTRAMUSCULAR | Status: AC
  Administered 2022-12-16: 60 mg via INTRAMUSCULAR

## 2022-12-16 NOTE — Discharge Instructions (Addendum)
Your symptoms are consistent with sciatica.  Please take the steroids by mouth with breakfast for the next 5 days.  You can do the sciatica rehab once you are flare resolves.  Please follow-up with [facility] for further evaluation and potential imaging.  Please return to clinic or seek immediate care if you develop inability to walk, fever, you are unable to to urinate or control your urination, or any new concerning symptoms.

## 2022-12-16 NOTE — ED Provider Notes (Signed)
[facility]    CSN: [id number] Arrival date & time: 12/16/22  1003      History   Chief Complaint Chief Complaint  Patient presents with   Back Pain    HPI Stephen Ward is a 42 y.o. male.   Patient reports he got up out of bed and was brushing his teeth Monday morning when he noticed his left sided back pain that hit him out of nowhere.  Reports the left-sided mid back pain radiates all the way down to his left calf.  Denies any known injuries or falls.  He does report a history of sciatica.  Has tried Motrin, Advil, warm compress, gentle stretching and soaking the area without relief.  Reports his job is not really physical, and always lifts with his legs and not his back.  Reports pins and needle feeling radiating down his left side.  Denies any incontinence.  Ambulatory.  Has been seeing a sports medicine provider who had recommended an MRI, reports he was unable to afford this so he never completed it.     The history is provided by the patient and medical records.  Back Pain Associated symptoms: numbness   Associated symptoms: no abdominal pain, no chest pain and no fever     Past Medical History:  Diagnosis Date   Back pain    Headache     Patient Active Problem List   Diagnosis Date Noted   Renal vein laceration, right, initial encounter 09/02/2017    History reviewed. No pertinent surgical history.     Home Medications    Prior to Admission medications   Medication Sig Start Date End Date Taking? Authorizing Provider  predniSONE (DELTASONE) 20 MG tablet Take 2 tablets (40 mg total) by mouth daily for 5 days. 12/16/22 12/21/22 Yes Rinaldo Ratel, [place] N, FNP  ibuprofen (ADVIL) 800 MG tablet Take 1 tablet (800 mg total) by mouth every 8 (eight) hours as needed. 07/20/22   Rolla Etienne, NP    Family History Family History  Problem Relation Age of Onset   Seizures Mother    Healthy Father     Social History Social History   Tobacco Use    Smoking status: Every Day    Packs/day: .5    Types: Cigarettes   Smokeless tobacco: Never  Vaping Use   Vaping Use: Never used  Substance Use Topics   Alcohol use: No   Drug use: No     Allergies   Bee venom, Bee venom, and Bactrim [sulfamethoxazole-trimethoprim]   Review of Systems Review of Systems  Constitutional:  Negative for fatigue and fever.  Respiratory:  Negative for cough.   Cardiovascular:  Negative for chest pain.  Gastrointestinal:  Negative for abdominal pain.  Genitourinary:  Negative for difficulty urinating.  Musculoskeletal:  Positive for back pain.  Neurological:  Positive for numbness.     Physical Exam Triage Vital Signs ED Triage Vitals  Enc Vitals Group     BP 12/16/22 1017 95/66     Pulse Rate 12/16/22 1017 72     Resp 12/16/22 1017 14     Temp 12/16/22 1017 (!) 97.4 F (36.3 C)     Temp Source 12/16/22 1017 Oral     SpO2 12/16/22 1017 98 %     Weight --      Height --      Head Circumference --      Peak Flow --      Pain Score 12/16/22  1016 9     Pain Loc --      Pain Edu? --      Excl. in GC? --    No data found.  Updated Vital Signs BP 95/66 (BP Location: Left Arm)   Pulse 72   Temp (!) 97.4 F (36.3 C) (Oral)   Resp 14   SpO2 98%   Visual Acuity Right Eye Distance:   Left Eye Distance:   Bilateral Distance:    Right Eye Near:   Left Eye Near:    Bilateral Near:     Physical Exam Vitals and nursing note reviewed.  Constitutional:      Appearance: Normal appearance.  HENT:     Head: Normocephalic and atraumatic.     Right Ear: External ear normal.     Left Ear: External ear normal.     Nose: Nose normal.     Mouth/Throat:     Mouth: Mucous membranes are moist.  Eyes:     General: No scleral icterus.    Conjunctiva/sclera: Conjunctivae normal.  Cardiovascular:     Rate and Rhythm: Normal rate and regular rhythm.  Pulmonary:     Effort: Pulmonary effort is normal. No respiratory distress.   Musculoskeletal:        General: Tenderness present. No swelling, deformity or signs of injury. Normal range of motion.     Cervical back: Normal.     Thoracic back: Tenderness present.     Lumbar back: Tenderness present.       Back:     Right lower leg: No edema.     Left lower leg: No edema.     Comments: L sided MS TTP  Skin:    General: Skin is warm and dry.  Neurological:     General: No focal deficit present.     Mental Status: He is alert and oriented to person, place, and time.  Psychiatric:        Mood and Affect: Mood normal.        Behavior: Behavior normal. Behavior is cooperative.      UC Treatments / Results  Labs (all labs ordered are listed, but only abnormal results are displayed) Labs Reviewed - No data to display  EKG   Radiology No results found.  Procedures Procedures (including critical care time)  Medications Ordered in UC Medications  methylPREDNISolone sodium succinate (SOLU-MEDROL) 125 mg/2 mL injection 80 mg (has no administration in time range)  ketorolac (TORADOL) injection 60 mg (has no administration in time range)    Initial Impression / Assessment and Plan / UC Course  I have reviewed the triage vital signs and the nursing notes.  Pertinent labs & imaging results that were available during my care of the patient were reviewed by me and considered in my medical decision making (see chart for details).  Vitals and triage reviewed, patient is hemodynamically stable.   Vitals and triage reviewed, patient is hemodynamically stable.  Left-sided thoracic and lumbar back pain that is tender to palpation, radiates down his left leg, history of sciatica.  Without red flag symptoms of cauda equina or incontinence.  Will treat with IM Toradol and steroids, sent home on 5 days of steroids.  Encouraged to follow-up with sports medicine for further evaluation.  Symptomatic management discussed.  Patient verbalized understanding, return and  follow-up precautions reviewed.     Final Clinical Impressions(s) / UC Diagnoses   Final diagnoses:  Left sided sciatica   Discharge Instructions  None    ED Prescriptions     Medication Sig Dispense Auth. Provider   predniSONE (DELTASONE) 20 MG tablet Take 2 tablets (40 mg total) by mouth daily for 5 days. 10 tablet Birttany Dechellis, [place] N, FNP      PDMP not reviewed this encounter.   Svetlana Bagby, [place] N, [place] 12/16/22 1036

## 2022-12-16 NOTE — ED Triage Notes (Signed)
Pt c/o mid to lower back pains since Monday. Tried home remedies, tylenol and ibuprofen without relief. Denies injury, fall or lifting.

## 2023-04-13 ENCOUNTER — Ambulatory Visit (HOSPITAL_COMMUNITY)
Admission: EM | Admit: 2023-04-13 | Discharge: 2023-04-13 | Disposition: A | Discharge status: Home / Self Care | Payer: Self-pay | Attending: Emergency Medicine | Admitting: Emergency Medicine

## 2023-04-13 ENCOUNTER — Encounter (HOSPITAL_COMMUNITY): Payer: Self-pay | Admitting: *Deleted

## 2023-04-13 DIAGNOSIS — M5432 Sciatica, left side: Secondary | ICD-10-CM | POA: Diagnosis not present

## 2023-04-13 MED ORDER — KETOROLAC TROMETHAMINE 60 MG/2ML IM SOLN
INTRAMUSCULAR | Status: AC
  Filled 2023-04-13: qty 2

## 2023-04-13 MED ORDER — PREDNISONE 10 MG (21) PO TBPK: ORAL_TABLET | ORAL | 0 refills | Status: DC

## 2023-04-13 MED ORDER — KETOROLAC TROMETHAMINE 60 MG/2ML IM SOLN
60.0000 mg | Freq: Once | INTRAMUSCULAR | Status: AC
  Administered 2023-04-13: 60 mg via INTRAMUSCULAR
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
## 2023-04-13 NOTE — Discharge Instructions (Signed)
Toradol injection given today to help with pain. Take prednisone as prescribed to help with sciatica. Recommend doing the stretches provided as well. Follow-up with ortho or spine specialist given recurrent issues.  Go to the ER if develop numbness to the groin or incontinence.
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
## 2023-04-25 ENCOUNTER — Encounter (HOSPITAL_COMMUNITY): Payer: Self-pay

## 2023-04-25 ENCOUNTER — Other Ambulatory Visit: Payer: Self-pay

## 2023-04-25 ENCOUNTER — Emergency Department (HOSPITAL_COMMUNITY)
Admission: EM | Admit: 2023-04-25 | Discharge: 2023-04-25 | Disposition: A | Discharge status: Home / Self Care | Payer: No Typology Code available for payment source | Attending: Emergency Medicine | Admitting: Emergency Medicine

## 2023-04-25 DIAGNOSIS — M5432 Sciatica, left side: Secondary | ICD-10-CM | POA: Insufficient documentation

## 2023-04-25 MED ORDER — OXYCODONE HCL 5 MG PO TABS: 5.0000 mg | ORAL_TABLET | Freq: Four times a day (QID) | ORAL | 0 refills | Status: AC | PRN

## 2023-04-25 MED ORDER — CYCLOBENZAPRINE HCL 10 MG PO TABS: 10.0000 mg | ORAL_TABLET | Freq: Three times a day (TID) | ORAL | 0 refills | Status: AC | PRN

## 2023-04-25 NOTE — ED Notes (Signed)
AVS with prescriptions provided to and discussed with patient and family member at bedside. Pt verbalizes understanding of discharge instructions and denies any questions or concerns at this time. Pt has ride home. Pt ambulated out of department independently with steady gait.  
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
## 2023-04-25 NOTE — Discharge Instructions (Addendum)
Thank you for letting us take care of you today.  Your symptoms are consistent with sciatica. You will need to follow up with orthopedics or primary care for continued management of this condition. Please take medications as below.  -Continue the prednisone taper -Take muscle relaxer as prescribed -For pain, start with ibuprofen 600mg  every 6 hours as needed. Add Tylenol 1000mg  every 6 hours as needed for continued pain. -If despite the ibuprofen and Tylenol you still have severe pain, take one tablet oxycodone 5mg . Limit use of this medication as it can make you drowsy, nauseated, and can lead to addiction if taken for prolonged periods. -Do exercises attached as tolerated. These can help rehabilitate your back and improve symptoms long-term.   Call orthopedics today to schedule follow up. You will likely need an MRI to better assess your symptoms. For new or worsening symptoms, return to nearest ED for re-evaluation.
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
## 2023-05-22 ENCOUNTER — Other Ambulatory Visit: Payer: Self-pay

## 2023-05-22 ENCOUNTER — Emergency Department (HOSPITAL_COMMUNITY)
Admission: EM | Admit: 2023-05-22 | Discharge: 2023-05-22 | Disposition: A | Discharge status: Home / Self Care | Payer: No Typology Code available for payment source | Attending: Emergency Medicine | Admitting: Emergency Medicine

## 2023-05-22 ENCOUNTER — Encounter (HOSPITAL_COMMUNITY): Payer: Self-pay

## 2023-05-22 DIAGNOSIS — M5432 Sciatica, left side: Secondary | ICD-10-CM | POA: Insufficient documentation

## 2023-05-22 MED ORDER — CELECOXIB 200 MG PO CAPS: 200.0000 mg | ORAL_CAPSULE | Freq: Two times a day (BID) | ORAL | 0 refills | Status: DC

## 2023-05-22 MED ORDER — CYCLOBENZAPRINE HCL 10 MG PO TABS
5.0000 mg | ORAL_TABLET | Freq: Two times a day (BID) | ORAL | 0 refills | Status: DC | PRN
Start: 2023-05-22 — End: 2023-06-11

## 2023-05-22 MED ORDER — OXYCODONE HCL 5 MG PO TABS: 2.5000 mg | ORAL_TABLET | ORAL | 0 refills | Status: DC | PRN

## 2023-05-22 NOTE — ED Triage Notes (Signed)
Patient reports worsening chronic left sciatic nerve pain. States today he is unable to get pain under control. Took ibuprofen without relief.

## 2023-05-22 NOTE — Discharge Instructions (Signed)
Contact a health care provider if: Your pain is not controlled by medicine. Your pain does not improve or gets worse. Your pain lasts longer than 4 weeks. You have unexplained weight loss. Get help right away if: You are not able to control when you urinate or have bowel movements (incontinence). You have: Weakness in your lower back, pelvis, buttocks, or legs that gets worse. Redness or swelling of your back. A burning sensation when you urinate.
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
## 2023-05-30 ENCOUNTER — Ambulatory Visit (HOSPITAL_COMMUNITY)
Admission: EM | Admit: 2023-05-30 | Discharge: 2023-05-30 | Disposition: A | Discharge status: Home / Self Care | Payer: No Typology Code available for payment source | Attending: Internal Medicine | Admitting: Internal Medicine

## 2023-05-30 ENCOUNTER — Encounter (HOSPITAL_COMMUNITY): Payer: Self-pay

## 2023-05-30 DIAGNOSIS — M5432 Sciatica, left side: Secondary | ICD-10-CM

## 2023-05-30 DIAGNOSIS — G8929 Other chronic pain: Secondary | ICD-10-CM

## 2023-05-30 MED ORDER — DICLOFENAC SODIUM 75 MG PO TBEC: 75.0000 mg | DELAYED_RELEASE_TABLET | Freq: Two times a day (BID) | ORAL | 0 refills | Status: AC

## 2023-05-30 NOTE — Discharge Instructions (Addendum)
Call tomorrow to schedule with a back specialist  Schedule a consultation with a [facility] provider. Referrals not required unless required by your insurance.  Schedule Online  or call [phone] [facility])  [phone] [facility])  Take the diclofenac in place of the Celebrex
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
## 2023-05-30 NOTE — ED Triage Notes (Signed)
Pt c/o lt sciatic nerve pain radiating to lt hip/leg x1wk. States has to stand at work and makes it worse. States needs to be put on light duty. States took prednisone and muscle relaxants with no relief.

## 2023-05-31 ENCOUNTER — Emergency Department (HOSPITAL_COMMUNITY)
Admission: EM | Admit: 2023-05-31 | Discharge: 2023-05-31 | Discharge status: Against Medical Advice | Payer: No Typology Code available for payment source | Attending: Emergency Medicine | Admitting: Emergency Medicine

## 2023-05-31 ENCOUNTER — Encounter (HOSPITAL_COMMUNITY): Payer: Self-pay

## 2023-05-31 DIAGNOSIS — M5430 Sciatica, unspecified side: Secondary | ICD-10-CM | POA: Diagnosis present

## 2023-05-31 DIAGNOSIS — G8929 Other chronic pain: Secondary | ICD-10-CM | POA: Diagnosis not present

## 2023-05-31 DIAGNOSIS — Z5321 Procedure and treatment not carried out due to patient leaving prior to being seen by health care provider: Secondary | ICD-10-CM | POA: Diagnosis not present

## 2023-05-31 NOTE — ED Triage Notes (Signed)
C/o chronic sciatic nerve pain that has worsened x 2 weeks, unable to work would like a light duty note for work. Was seen here a few days ago. Taking prescribed medication with minimal relief.

## 2023-06-11 ENCOUNTER — Encounter (HOSPITAL_COMMUNITY): Payer: Self-pay

## 2023-06-11 ENCOUNTER — Ambulatory Visit (HOSPITAL_COMMUNITY)
Admission: EM | Admit: 2023-06-11 | Discharge: 2023-06-11 | Disposition: A | Discharge status: Home / Self Care | Payer: No Typology Code available for payment source | Attending: Internal Medicine | Admitting: Internal Medicine

## 2023-06-11 DIAGNOSIS — M5432 Sciatica, left side: Secondary | ICD-10-CM | POA: Diagnosis not present

## 2023-06-11 MED ORDER — CELECOXIB 200 MG PO CAPS: 200.0000 mg | ORAL_CAPSULE | Freq: Two times a day (BID) | ORAL | 0 refills | Status: DC

## 2023-06-11 MED ORDER — CYCLOBENZAPRINE HCL 10 MG PO TABS
5.0000 mg | ORAL_TABLET | Freq: Two times a day (BID) | ORAL | 0 refills | Status: DC | PRN
Start: 2023-06-11 — End: 2023-07-22

## 2023-06-11 MED ORDER — PREDNISONE 10 MG (21) PO TBPK: ORAL_TABLET | Freq: Every day | ORAL | 0 refills | Status: DC

## 2023-06-11 NOTE — Discharge Instructions (Addendum)
Please take medications as prescribed Gentle stretching exercises as the pain improves You will benefit from orthopedic surgery evaluation given the CT scan findings from September 26, 2021 Please do not drive or operate heavy machinery after muscle relaxant use.  I would encourage you to take the muscle relaxants at bedtime as needed. Please feel free to return to urgent care if you have worsening symptoms. Please keep your appointment with the orthopedic surgery team.

## 2023-06-11 NOTE — ED Triage Notes (Signed)
Patient here today with c/o left side LB pain that started 4 days ago. Patient has a h/o sciatica. No new injury. He has been taking IBU with little relief.

## 2023-06-11 NOTE — ED Provider Notes (Signed)
[facility]    CSN: [id number] Arrival date & time: 06/11/23  1515      History   Chief Complaint Chief Complaint  Patient presents with   Back Pain    HPI Stephen Ward is a 42 y.o. male with a history of sciatica comes to urgent care with 4-day history of worsening pain to the left back.  Pain is of moderate severity, sharp and aggravated by movement.  Pain radiates to the left foot.  He has some numbness but no weakness in the lower extremities.  Patient denies any fall or trauma to the back.  This problem is recurrent.  CT scan done in February 2023 shows degenerative disc disease at the level of L5-S1 with moderate foraminal narrowing.  Patient has back surgery appointment in November 21  HPI  Past Medical History:  Diagnosis Date   Back pain    Headache     Patient Active Problem List   Diagnosis Date Noted   Renal vein laceration, right, initial encounter 09/02/2017    History reviewed. No pertinent surgical history.     Home Medications    Prior to Admission medications   Medication Sig Start Date End Date Taking? Authorizing Provider  predniSONE (STERAPRED UNI-PAK 21 TAB) 10 MG (21) TBPK tablet Take by mouth daily. Take 6 tabs by mouth daily  for 2 days, then 5 tabs for 2 days, then 4 tabs for 2 days, then 3 tabs for 2 days, 2 tabs for 2 days, then 1 tab by mouth daily for 2 days 06/11/23  Yes Vladislav Axelson, Britta Mccreedy, MD  celecoxib (CELEBREX) 200 MG capsule Take 1 capsule (200 mg total) by mouth 2 (two) times daily. 06/11/23   Isley Weisheit, Britta Mccreedy, MD  cyclobenzaprine (FLEXERIL) 10 MG tablet Take 0.5-1 tablets (5-10 mg total) by mouth 2 (two) times daily as needed for muscle spasms. 06/11/23   Victorine Mcnee, Britta Mccreedy, MD    Family History Family History  Problem Relation Age of Onset   Seizures Mother    Healthy Father     Social History Social History   Tobacco Use   Smoking status: Every Day    Current packs/day: 0.50    Types: Cigarettes    Smokeless tobacco: Never  Vaping Use   Vaping status: Never Used  Substance Use Topics   Alcohol use: No   Drug use: No     Allergies   Bee venom, Bee venom, and Bactrim [sulfamethoxazole-trimethoprim]   Review of Systems Review of Systems As per HPI  Physical Exam Triage Vital Signs ED Triage Vitals  Encounter Vitals Group     BP 06/11/23 1634 (!) 114/90     Systolic BP Percentile --      Diastolic BP Percentile --      Pulse Rate 06/11/23 1634 72     Resp 06/11/23 1634 16     Temp 06/11/23 1634 98 F (36.7 C)     Temp Source 06/11/23 1634 Oral     SpO2 06/11/23 1634 98 %     Weight 06/11/23 1634 208 lb (94.3 kg)     Height 06/11/23 1634 5\' 7"  (1.702 m)     Head Circumference --      Peak Flow --      Pain Score 06/11/23 1633 9     Pain Loc --      Pain Education --      Exclude from Growth Chart --    No data found.  Updated Vital Signs BP (!) 114/90 (BP Location: Right Arm)   Pulse 72   Temp 98 F (36.7 C) (Oral)   Resp 16   Ht 5\' 7"  (1.702 m)   Wt 94.3 kg   SpO2 98%   BMI 32.58 kg/m   Visual Acuity Right Eye Distance:   Left Eye Distance:   Bilateral Distance:    Right Eye Near:   Left Eye Near:    Bilateral Near:     Physical Exam Vitals and nursing note reviewed.  Constitutional:      General: He is in acute distress.     Appearance: Normal appearance.  Cardiovascular:     Rate and Rhythm: Normal rate and regular rhythm.  Musculoskeletal:        General: Normal range of motion.     Comments: Tenderness on palpation over the paraspinal muscle in the lumbar region.  Full range of motion of the lumbar spine.  Skin:    General: Skin is warm.  Neurological:     General: No focal deficit present.     Mental Status: He is alert and oriented to person, place, and time.     Comments: Deep tendon reflexes 2+ at patella ligaments.  Power is 5/5 in both lower extremities.  No sensory deficits.      UC Treatments / Results  Labs (all labs  ordered are listed, but only abnormal results are displayed) Labs Reviewed - No data to display  EKG   Radiology No results found.  Procedures Procedures (including critical care time)  Medications Ordered in UC Medications - No data to display  Initial Impression / Assessment and Plan / UC Course  I have reviewed the triage vital signs and the nursing notes.  Pertinent labs & imaging results that were available during my care of the patient were reviewed by me and considered in my medical decision making (see chart for details).     1.  Sciatica of the left side: Tapering dose of prednisone Celebrex 200 mg twice daily Flexeril half to 1 tablet twice daily as needed for muscle spasms Medication precautions given Patient is encouraged to keep his appointment with the back surgeon Return precautions given. Final Clinical Impressions(s) / UC Diagnoses   Final diagnoses:  Sciatica of left side     Discharge Instructions      Please take medications as prescribed Gentle stretching exercises as the pain improves You will benefit from orthopedic surgery evaluation given the CT scan findings from September 26, 2021 Please do not drive or operate heavy machinery after muscle relaxant use.  I would encourage you to take the muscle relaxants at bedtime as needed. Please feel free to return to urgent care if you have worsening symptoms. Please keep your appointment with the orthopedic surgery team.   ED Prescriptions     Medication Sig Dispense Auth. Provider   celecoxib (CELEBREX) 200 MG capsule Take 1 capsule (200 mg total) by mouth 2 (two) times daily. 20 capsule Yaslin Kirtley, Britta Mccreedy, MD   cyclobenzaprine (FLEXERIL) 10 MG tablet Take 0.5-1 tablets (5-10 mg total) by mouth 2 (two) times daily as needed for muscle spasms. 20 tablet Trevian Hayashida, Britta Mccreedy, MD   predniSONE (STERAPRED UNI-PAK 21 TAB) 10 MG (21) TBPK tablet Take by mouth daily. Take 6 tabs by mouth daily  for 2 days, then  5 tabs for 2 days, then 4 tabs for 2 days, then 3 tabs for 2 days, 2 tabs for 2 days,  then 1 tab by mouth daily for 2 days 42 tablet Maccoy Haubner, Britta Mccreedy, MD      PDMP not reviewed this encounter.   Merrilee Jansky, MD 06/11/23 [phone]

## 2023-07-22 ENCOUNTER — Other Ambulatory Visit: Payer: Self-pay

## 2023-07-22 ENCOUNTER — Encounter (HOSPITAL_COMMUNITY): Payer: Self-pay | Admitting: Emergency Medicine

## 2023-07-22 ENCOUNTER — Emergency Department (HOSPITAL_COMMUNITY)
Admission: EM | Admit: 2023-07-22 | Discharge: 2023-07-22 | Disposition: A | Discharge status: Home / Self Care | Payer: No Typology Code available for payment source | Attending: Emergency Medicine | Admitting: Emergency Medicine

## 2023-07-22 DIAGNOSIS — M5441 Lumbago with sciatica, right side: Secondary | ICD-10-CM | POA: Diagnosis not present

## 2023-07-22 DIAGNOSIS — G8929 Other chronic pain: Secondary | ICD-10-CM | POA: Diagnosis not present

## 2023-07-22 DIAGNOSIS — M5442 Lumbago with sciatica, left side: Secondary | ICD-10-CM | POA: Diagnosis not present

## 2023-07-22 DIAGNOSIS — M545 Low back pain, unspecified: Secondary | ICD-10-CM | POA: Diagnosis present

## 2023-07-22 MED ORDER — CYCLOBENZAPRINE HCL 10 MG PO TABS
5.0000 mg | ORAL_TABLET | Freq: Two times a day (BID) | ORAL | 0 refills | Status: DC | PRN
Start: 2023-07-22 — End: 2024-01-07

## 2023-07-22 MED ORDER — CELECOXIB 200 MG PO CAPS: 200.0000 mg | ORAL_CAPSULE | Freq: Two times a day (BID) | ORAL | 0 refills | Status: DC

## 2023-07-22 NOTE — ED Triage Notes (Addendum)
Patient presents due to low back back on both sides since Tuesday. The pain makes walking difficult; he can not walk without crutches. 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 Denies changes in bowel and bladder.

## 2023-07-22 NOTE — Discharge Instructions (Signed)
You have been seen today for your complaint of back pain. Your discharge medications include Celebrex.  This is an NSAID pain medication.  Take it as prescribed but only as needed.  Do not take it chronically as it may cause a stomach ulcers. Flexeril. This is a muscle relaxer. It may cause drowsiness. Do not drive, operate heavy machinery or make important decisions when taking this medication. Only take it at night until you know how it affects you. Only take it as needed and take other medications such as ibuprofen or tylenol prior to trying this medication. Follow up with: The spine specialist.  Call the phone number listed in this packet to schedule an appointment Please seek immediate medical care if you develop any of the following symptoms: You are not able to control when you urinate or have bowel movements (incontinence). You have: Weakness in your lower back, pelvis, buttocks, or legs that gets worse. Redness or swelling of your back. A burning sensation when you urinate. At this time there does not appear to be the presence of an emergent medical condition, however there is always the potential for conditions to change. Please read and follow the below instructions.  Do not take your medicine if  develop an itchy rash, swelling in your mouth or lips, or difficulty breathing; call 911 and seek immediate emergency medical attention if this occurs.  You may review your lab tests and imaging results in their entirety on your MyChart account.  Please discuss all results of fully with your primary care provider and other specialist at your follow-up visit.  Note: Portions of this text may have been transcribed using voice recognition software. Every effort was made to ensure accuracy; however, inadvertent computerized transcription errors may still be present.

## 2023-07-22 NOTE — ED Provider Notes (Signed)
[place] EMERGENCY DEPARTMENT AT [facility] Provider Note   CSN: [id number] Arrival date & time: 07/22/23  1325     History  Chief Complaint  Patient presents with   Back Pain    Stephen Ward is a 42 y.o. male.  With a history of back pain presenting to the ED for evaluation of low back pain.  Patient states he has had the pain intermittently for the past 6 to 7 years.  He has tried numerous therapies with minimal improvement.  States he receives most improvement from muscle relaxers and NSAIDs like Celebrex.  He states he has attempted to contact multiple spine specialists but keeps getting his insurance denied.  Pain is in his bilateral low back, but worse on the right.  Pain radiates to the left knee and notably down to the right foot.  Is localized to the posterior of his leg.  Described as sharp and shooting pain.  He reports some tingling but no numbness or weakness.  The symptoms got worse approximately 5 days ago.  He states constant movement makes the pain better but being sedentary worsens the pain.  He denies any saddle anesthesia, urinary or fecal incontinence, history of injection drug use, fevers or chills.  Patient was scheduled for neurosurgery office visit on 07/16/2023 but canceled due to transportation issue.  He had an appointment with physical therapy on 07/12/2023   Back Pain      Home Medications Prior to Admission medications   Medication Sig Start Date End Date Taking? Authorizing Provider  celecoxib (CELEBREX) 200 MG capsule Take 1 capsule (200 mg total) by mouth 2 (two) times daily. 07/22/23   Glennie Bose, Edsel Petrin, PA-C  cyclobenzaprine (FLEXERIL) 10 MG tablet Take 0.5-1 tablets (5-10 mg total) by mouth 2 (two) times daily as needed for muscle spasms. 07/22/23   Amoy Steeves, Edsel Petrin, PA-C  predniSONE (STERAPRED UNI-PAK 21 TAB) 10 MG (21) TBPK tablet Take by mouth daily. Take 6 tabs by mouth daily  for 2 days, then 5 tabs for 2 days, then 4 tabs  for 2 days, then 3 tabs for 2 days, 2 tabs for 2 days, then 1 tab by mouth daily for 2 days 06/11/23   Lamptey, Britta Mccreedy, MD      Allergies    Bee venom, Bee venom, and Bactrim [sulfamethoxazole-trimethoprim]    Review of Systems   Review of Systems  Musculoskeletal:  Positive for back pain.  All other systems reviewed and are negative.   Physical Exam Updated Vital Signs BP 134/78   Pulse 99   Temp 98.8 F (37.1 C) (Oral)   Resp (!) 22   SpO2 100%  Physical Exam Vitals and nursing note reviewed.  Constitutional:      General: He is not in acute distress.    Appearance: Normal appearance. He is normal weight. He is not ill-appearing.  HENT:     Head: Normocephalic and atraumatic.  Pulmonary:     Effort: Pulmonary effort is normal. No respiratory distress.  Abdominal:     General: Abdomen is flat.  Musculoskeletal:        General: Normal range of motion.     Cervical back: Neck supple.     Comments: Started to raise positive bilaterally.  Hip strength 5 out of 5 in flexion bilaterally.  Knee strength 5 out of 5 in flexion and extension bilaterally.  Slightly decreased effort on the right secondary to pain.  Bilateral paraspinal TTP.  No specific  midline T or L-spine TTP.  Sensation intact distally.  No rashes.  Skin:    General: Skin is warm and dry.  Neurological:     Mental Status: He is alert and oriented to person, place, and time.  Psychiatric:        Mood and Affect: Mood normal.        Behavior: Behavior normal.     ED Results / Procedures / Treatments   Labs (all labs ordered are listed, but only abnormal results are displayed) Labs Reviewed - No data to display  EKG None  Radiology No results found.  Procedures Procedures    Medications Ordered in ED Medications - No data to display  ED Course/ Medical Decision Making/ A&P                                 Medical Decision Making This patient presents to the ED for concern of low back pain,  this involves an extensive number of treatment options, and is a complaint that carries with it a high risk of complications and morbidity.  Emergent considerations in the differential diagnosis of back pain include:occult fracture, congenital anomalies, tumors, vascular catastrophes, osteomyelitis of vertebrae, infections of disc, meninges or cord, space occupying lesions within canal leading to cord or root compression including epidural abscess.   Additional history obtained from: Nursing notes from this visit.  Afebrile, hemodynamically stable.  42 year old male presenting for evaluation of bilateral low back pain.  This is a chronic issue for him and states that has been present for the past 6 to 7 years.  He has been unable to follow-up with a spine specialist.  He states he is very active at work which may be the cause of his exacerbation.  Physical exam is consistent with sciatic nerve pain.  He has recently been to physical therapy for this.  He is requesting prescriptions for a muscle relaxer and Celebrex.  Believe this is reasonable.  I had a shared decision-making apposition with the patient regarding repeat imaging.  Do not believe this is necessary at this time given the longevity of his symptoms.  No red flag signs or symptoms to suggest cord compression.  Physical exam is overall reassuring but consistent with sciatic nerve pain.  Will send prescription for Flexeril and Celebrex.  He was given contact information for the spine and scoliosis specialists.  He was given return precautions.  Stable at discharge.  At this time there does not appear to be any evidence of an acute emergency medical condition and the patient appears stable for discharge with appropriate outpatient follow up. Diagnosis was discussed with patient who verbalizes understanding of care plan and is agreeable to discharge. I have discussed return precautions with patient who verbalizes understanding. Patient encouraged  to follow-up with their PCP within 1 week. All questions answered.  Note: Portions of this report may have been transcribed using voice recognition software. Every effort was made to ensure accuracy; however, inadvertent computerized transcription errors may still be present.        Final Clinical Impression(s) / ED Diagnoses Final diagnoses:  Chronic bilateral low back pain with bilateral sciatica    Rx / DC Orders ED Discharge Orders          Ordered    celecoxib (CELEBREX) 200 MG capsule  2 times daily        07/22/23 1452    cyclobenzaprine (FLEXERIL)  10 MG tablet  2 times daily PRN        07/22/23 1452              Mora Bellman 07/22/23 1452    Terrilee Files, MD 07/23/23 1245

## 2024-01-02 ENCOUNTER — Encounter (HOSPITAL_COMMUNITY): Payer: Self-pay

## 2024-01-02 ENCOUNTER — Emergency Department (HOSPITAL_COMMUNITY)

## 2024-01-02 ENCOUNTER — Other Ambulatory Visit: Payer: Self-pay

## 2024-01-02 ENCOUNTER — Emergency Department (HOSPITAL_COMMUNITY): Admission: EM | Admit: 2024-01-02 | Discharge: 2024-01-02 | Discharge status: Against Medical Advice

## 2024-01-02 DIAGNOSIS — M79606 Pain in leg, unspecified: Secondary | ICD-10-CM

## 2024-01-02 DIAGNOSIS — Z5321 Procedure and treatment not carried out due to patient leaving prior to being seen by health care provider: Secondary | ICD-10-CM | POA: Insufficient documentation

## 2024-01-02 NOTE — ED Notes (Addendum)
 Patient walked out of department stating he does not want to wait any longer. Dr. Linder Revere notified.

## 2024-01-02 NOTE — ED Provider Notes (Signed)
 Patient eloped from the department prior to my evaluation.     Rolinda Climes, DO 01/02/24 [phone]

## 2024-01-02 NOTE — ED Triage Notes (Signed)
 Patient has left shin pain that feels like a knot and it moves up to his left knee and left thigh. Stated he ran out of percocet and it used to help his pain. Ambulatory.

## 2024-01-02 NOTE — ED Provider Triage Note (Signed)
 Emergency Medicine Provider Triage Evaluation Note  LEVI MULBERRY , a 43 y.o. male  was evaluated in triage.  Pt complains of left pain.  Review of Systems  Positive:  Negative:   Physical Exam  BP 121/82   Pulse 93   Temp 98.2 F (36.8 C) (Oral)   Resp 18   Ht 5\' 7"  (1.702 m)   Wt 95.3 kg   SpO2 99%   BMI 32.89 kg/m  Gen:   Awake, no distress   Resp:  Normal effort  MSK:   Moves extremities without difficulty  Other:    Medical Decision Making  Medically screening exam initiated at 2:52 PM.  Appropriate orders placed.  Coretta Dexter was informed that the remainder of the evaluation will be completed by another provider, this initial triage assessment does not replace that evaluation, and the importance of remaining in the ED until their evaluation is complete.  Left knot in left shin that has been hurting since 2019. Patient stating that the pain radiates all the way down into foot and all the way up left thigh. Patient with ortho appointment in July.    Dorisann Garre F, [place] 01/02/24 1453

## 2024-01-03 ENCOUNTER — Ambulatory Visit (HOSPITAL_COMMUNITY)
Admission: EM | Admit: 2024-01-03 | Discharge: 2024-01-03 | Disposition: A | Discharge status: Home / Self Care | Attending: Emergency Medicine | Admitting: Emergency Medicine

## 2024-01-03 ENCOUNTER — Encounter (HOSPITAL_COMMUNITY): Payer: Self-pay

## 2024-01-03 DIAGNOSIS — M79605 Pain in left leg: Secondary | ICD-10-CM | POA: Diagnosis not present

## 2024-01-03 MED ORDER — CELECOXIB 200 MG PO CAPS: 200.0000 mg | ORAL_CAPSULE | Freq: Two times a day (BID) | ORAL | 0 refills | Status: DC

## 2024-01-03 NOTE — ED Provider Notes (Signed)
 [facility]    CSN: [id number] Arrival date & time: 01/03/24  1221      History   Chief Complaint Chief Complaint  Patient presents with   Ankle Pain    HPI Stephen Ward is a 43 y.o. male.   Patient presents with left ankle pain that radiates up to his groin.  Patient states that he has history of being diagnosed with sciatica related pain.  Patient states that his pain normally presents like this and denies any pain to his back that radiates down his leg at this time.  Patient states that previously he was given Celebrex  with the most relief.  Patient states that he has been taking diclofenac  with minimal relief.  Patient denies any recent falls or new injury.  The history is provided by the patient and medical records.  Ankle Pain   Past Medical History:  Diagnosis Date   Back pain    Headache     Patient Active Problem List   Diagnosis Date Noted   Renal vein laceration, right, initial encounter 09/02/2017    History reviewed. No pertinent surgical history.     Home Medications    Prior to Admission medications   Medication Sig Start Date End Date Taking? Authorizing Provider  cyclobenzaprine  (FLEXERIL ) 10 MG tablet Take 0.5-1 tablets (5-10 mg total) by mouth 2 (two) times daily as needed for muscle spasms. 07/22/23  Yes Schutt, Coni Deep, PA-C  celecoxib  (CELEBREX ) 200 MG capsule Take 1 capsule (200 mg total) by mouth 2 (two) times daily. 01/03/24   Karon Packer, NP    Family History Family History  Problem Relation Age of Onset   Seizures Mother    Healthy Father     Social History Social History   Tobacco Use   Smoking status: Every Day    Current packs/day: 0.50    Types: Cigarettes   Smokeless tobacco: Never  Vaping Use   Vaping status: Every Day  Substance Use Topics   Alcohol use: No   Drug use: No     Allergies   Bee venom, Bee venom, and Bactrim  [sulfamethoxazole -trimethoprim ]   Review of Systems Review of  Systems  Per HPI  Physical Exam Triage Vital Signs ED Triage Vitals  Encounter Vitals Group     BP 01/03/24 1407 113/70     Systolic BP Percentile --      Diastolic BP Percentile --      Pulse Rate 01/03/24 1407 73     Resp 01/03/24 1407 17     Temp 01/03/24 1407 98.1 F (36.7 C)     Temp Source 01/03/24 1407 Oral     SpO2 01/03/24 1407 96 %     Weight 01/03/24 1402 210 lb (95.3 kg)     Height 01/03/24 1402 5\' 7"  (1.702 m)     Head Circumference --      Peak Flow --      Pain Score 01/03/24 1401 8     Pain Loc --      Pain Education --      Exclude from Growth Chart --    No data found.  Updated Vital Signs BP 113/70 (BP Location: Right Arm)   Pulse 73   Temp 98.1 F (36.7 C) (Oral)   Resp 17   Ht 5\' 7"  (1.702 m)   Wt 210 lb (95.3 kg)   SpO2 96%   BMI 32.89 kg/m   Visual Acuity Right Eye Distance:  Left Eye Distance:   Bilateral Distance:    Right Eye Near:   Left Eye Near:    Bilateral Near:     Physical Exam Vitals and nursing note reviewed.  Constitutional:      General: He is awake. He is not in acute distress.    Appearance: Normal appearance. He is well-developed and well-groomed. He is not ill-appearing.  Musculoskeletal:     Cervical back: Normal.     Thoracic back: Normal.     Lumbar back: Normal.     Left lower leg: Tenderness present.     Left ankle: No swelling or deformity. Tenderness present. Normal range of motion.     Comments: Patient endorses tenderness to generalized left ankle and left lower leg.  Skin:    General: Skin is warm and dry.  Neurological:     Mental Status: He is alert.  Psychiatric:        Behavior: Behavior is cooperative.      UC Treatments / Results  Labs (all labs ordered are listed, but only abnormal results are displayed) Labs Reviewed - No data to display  EKG   Radiology DG Tibia/Fibula Left Result Date: 01/02/2024 CLINICAL DATA:  Pain and focal swelling along the shin EXAM: LEFT TIBIA AND  FIBULA - 2 VIEW COMPARISON:  None Available. FINDINGS: Mild spurring of proximal fibular head. No fracture or acute bony findings. IMPRESSION: 1. Mild spurring of the proximal fibular head. No acute findings. If there is an unexplained mass or specific concern for tibial stress syndrome, MRI of the tibia/fibula should be considered. Electronically Signed   By: Freida Jes M.D.   On: 01/02/2024 15:19    Procedures Procedures (including critical care time)  Medications Ordered in UC Medications - No data to display  Initial Impression / Assessment and Plan / UC Course  I have reviewed the triage vital signs and the nursing notes.  Pertinent labs & imaging results that were available during my care of the patient were reviewed by me and considered in my medical decision making (see chart for details).     Patient is well-appearing.  Vitals are stable.  Upon assessment there is generalized tenderness to left ankle and left lower leg.  Deferred imaging at this time due to no new injury and patient stating that pain is consistent with chronic pain.  Prescribed Celebrex  as needed for pain.  Recommended alternating with Tylenol  as needed.  Given orthopedic follow-up if needed.  Given [place] [facility] to follow-up with regarding getting established with a primary care provider.  Discussed return precautions. Final Clinical Impressions(s) / UC Diagnoses   Final diagnoses:  Left leg pain     Discharge Instructions      Take Celebrex  twice daily to help with your pain.  Do not take this with other NSAIDs including ibuprofen , diclofenac , Motrin , Advil , Aleve , and Goody powder. You can take 650 mg of Tylenol  every 6-8 hours as needed for breakthrough pain. Otherwise alternate between ice and heat as needed for pain. Follow-up with [facility] [facility] for further evaluation and management of your chronic pain. You can also follow-up with [place] [facility] to get established with a primary care provider. Return here as needed.  ED Prescriptions     Medication Sig Dispense Auth. Provider   celecoxib  (CELEBREX ) 200 MG capsule Take 1 capsule (200 mg total) by mouth 2 (two) times daily. 20 capsule Karon Packer, NP  PDMP not reviewed this encounter.   Levora Reas A, NP 01/03/24 1441

## 2024-01-03 NOTE — ED Triage Notes (Signed)
 Pt states that he has some left ankle pain that radiates up to his groing.  Pt states that he does have a history of sciatic pain. Pt states that this is a ongoing problem.

## 2024-01-03 NOTE — Discharge Instructions (Signed)
 Take Celebrex  twice daily to help with your pain.  Do not take this with other NSAIDs including ibuprofen , diclofenac , Motrin , Advil , Aleve , and Goody powder. You can take 650 mg of Tylenol  every 6-8 hours as needed for breakthrough pain. Otherwise alternate between ice and heat as needed for pain. Follow-up with [facility] for further evaluation and management of your chronic pain. You can also follow-up with [place] [facility] to get established with a primary care provider. Return here as needed.

## 2024-01-07 ENCOUNTER — Ambulatory Visit (INDEPENDENT_AMBULATORY_CARE_PROVIDER_SITE_OTHER): Admitting: Family Medicine

## 2024-01-07 VITALS — BP 121/67 | Ht 67.0 in | Wt 210.0 lb

## 2024-01-07 DIAGNOSIS — M5416 Radiculopathy, lumbar region: Secondary | ICD-10-CM

## 2024-01-07 MED ORDER — CELECOXIB 200 MG PO CAPS: 200.0000 mg | ORAL_CAPSULE | Freq: Two times a day (BID) | ORAL | 1 refills | Status: DC

## 2024-01-07 MED ORDER — METHOCARBAMOL 500 MG PO TABS: 500.0000 mg | ORAL_TABLET | Freq: Three times a day (TID) | ORAL | 1 refills | Status: DC | PRN
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
## 2024-01-07 NOTE — Patient Instructions (Addendum)
 You have lumbar radiculopathy (a pinched nerve in your low back). Ok to take tylenol  for baseline pain relief (1-2 extra strength tabs 3x/day). Celebrex  twice a day with food for pain and inflammation. Robaxin  as needed for muscle spasms (no driving on this medicine if it makes you sleepy). Stay as active as possible. We will go ahead with an MRI of your lumbar spine given how much weakness you have in your left leg. Ok to do home exercises if tolerated. Follow up after MRI results come back (virtual visit is fine) to go over those and next steps.

## 2024-01-18 ENCOUNTER — Ambulatory Visit
Admission: RE | Admit: 2024-01-18 | Discharge: 2024-01-18 | Disposition: A | Discharge status: Home / Self Care | Source: Ambulatory Visit | Attending: Family Medicine | Admitting: Family Medicine

## 2024-01-18 DIAGNOSIS — M5416 Radiculopathy, lumbar region: Secondary | ICD-10-CM

## 2024-01-22 IMAGING — CT CT L SPINE W/O CM
3 of 5 series · 9 of 35 positions shown, 11 images · non-contrast
Comparison: CT abdomen and pelvis dated October 02, 2017

CLINICAL DATA: Lumbar radiculopathy.  Infection suspected.



[Series 7: axial reformats bone · axial · 0.25mm/px · z∈[-255,-255]mm · 1 of 136 slices shown, 2 images]
[im 82/136  soft-tissue]
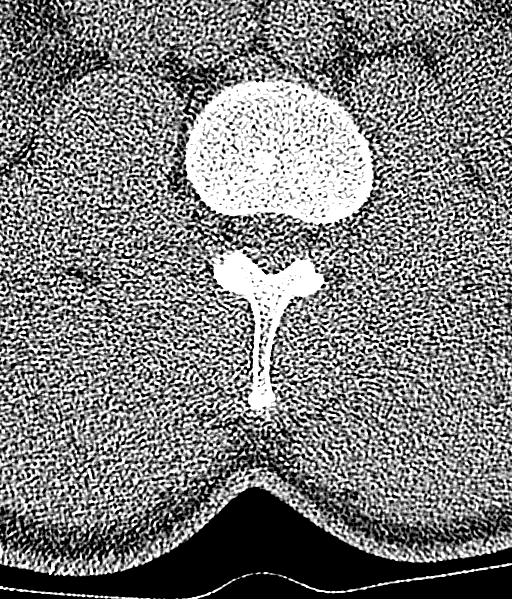
[im 82/136  bone]
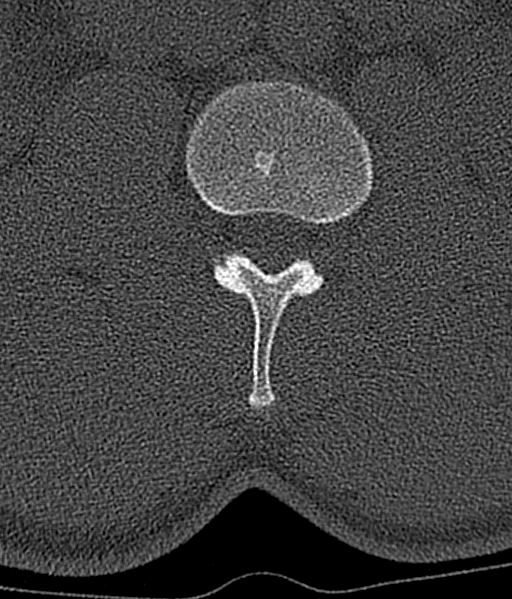

[Series 8: coronal bone · coronal · 0.25mm/px · 3 of 77 slices shown]
[im 16/77  bone]
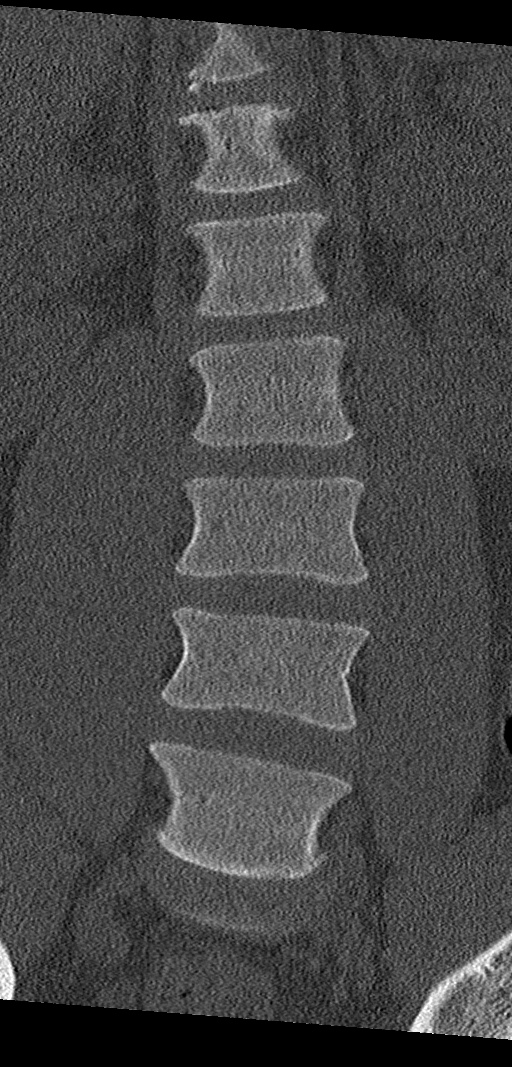
[im 31/77  bone]
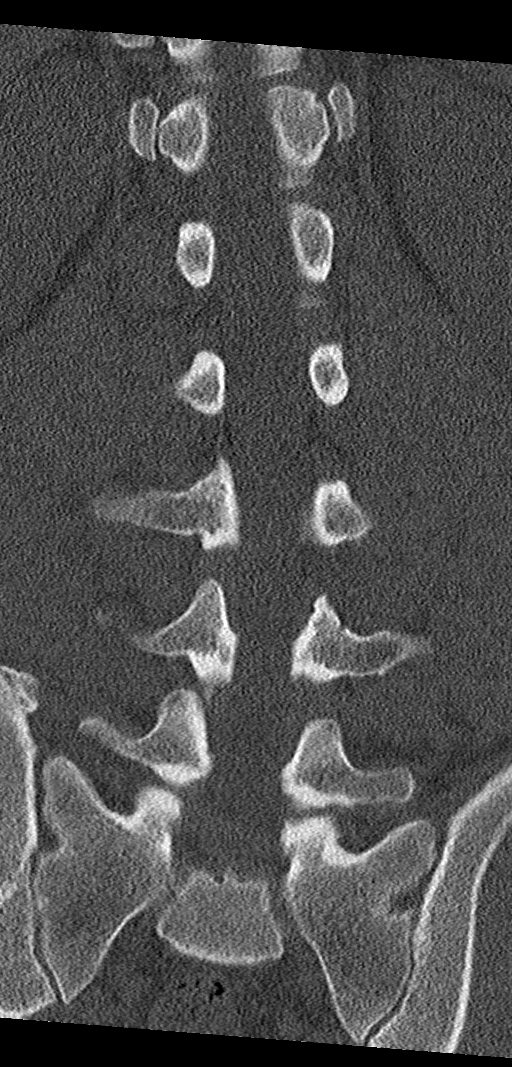
[im 46/77  bone]
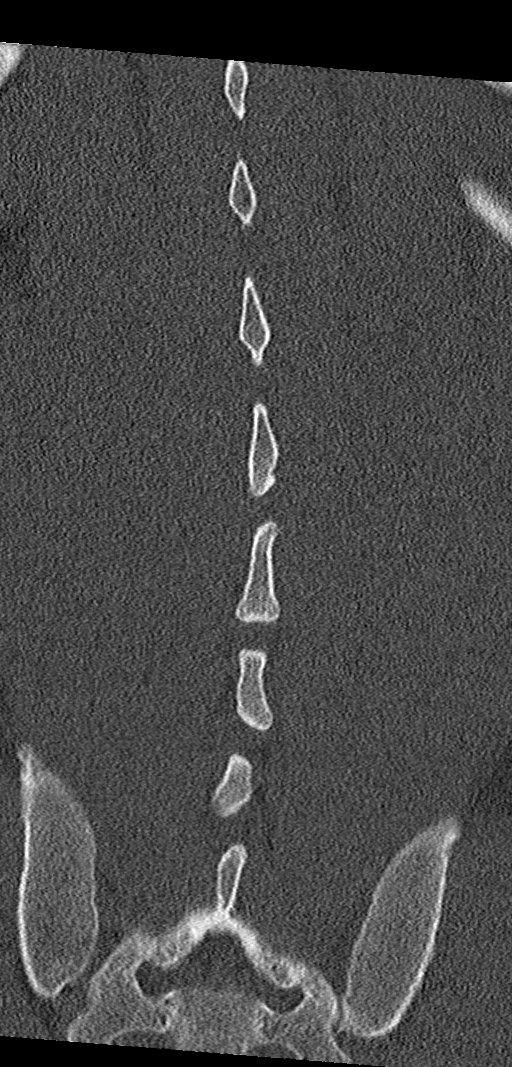

[Series 10: sagittal st · sagittal · 0.30mm/px · 5 of 66 slices shown, 6 images]
[im 22/66  bone]
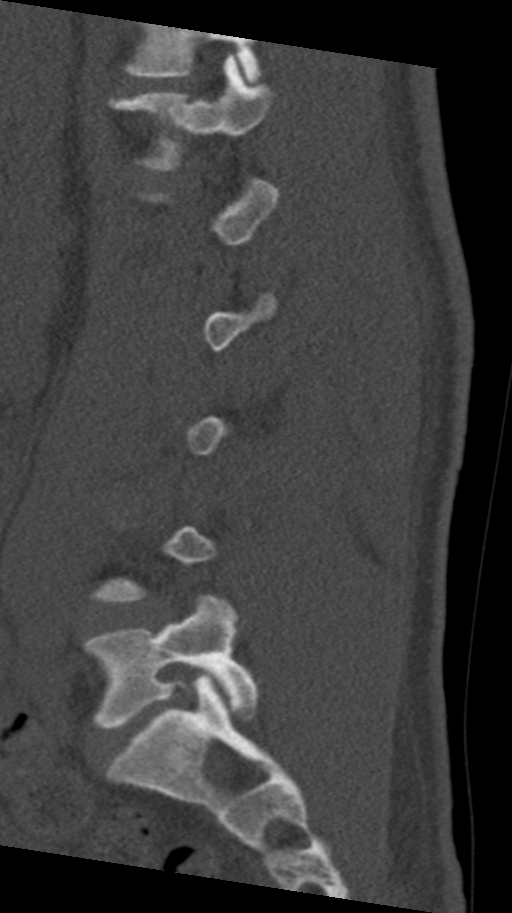
[im 28/66  bone]
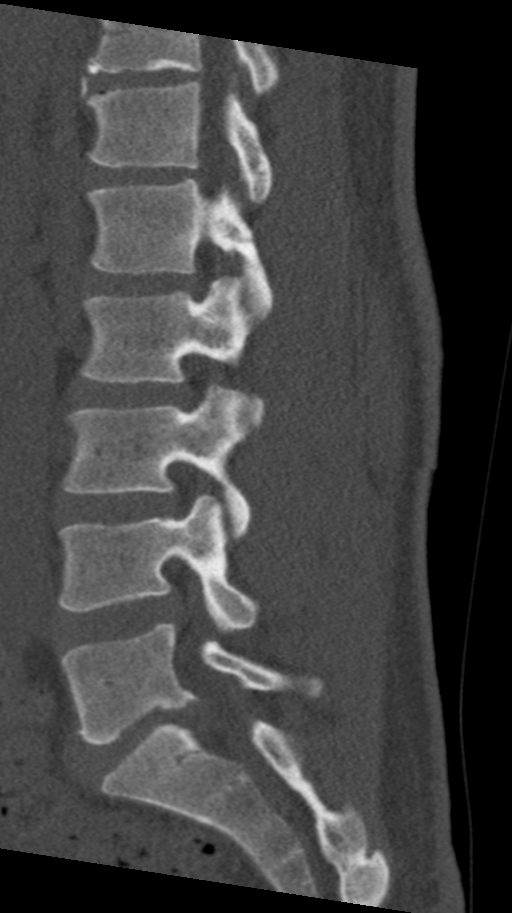
[im 33/66  soft-tissue]
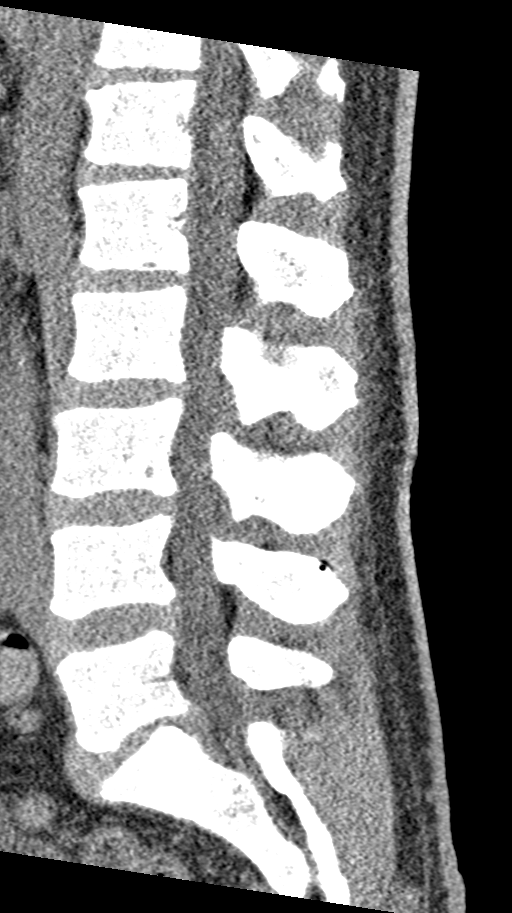
[im 33/66  bone]
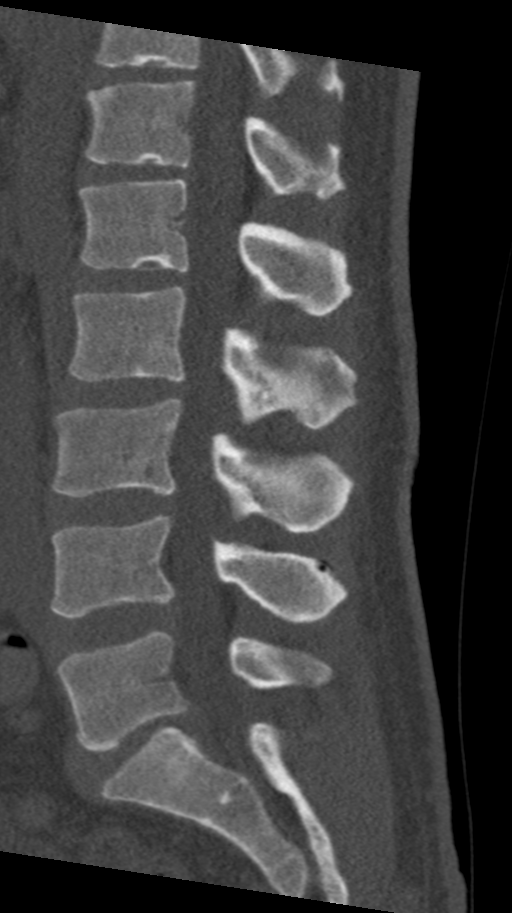
[im 38/66  bone]
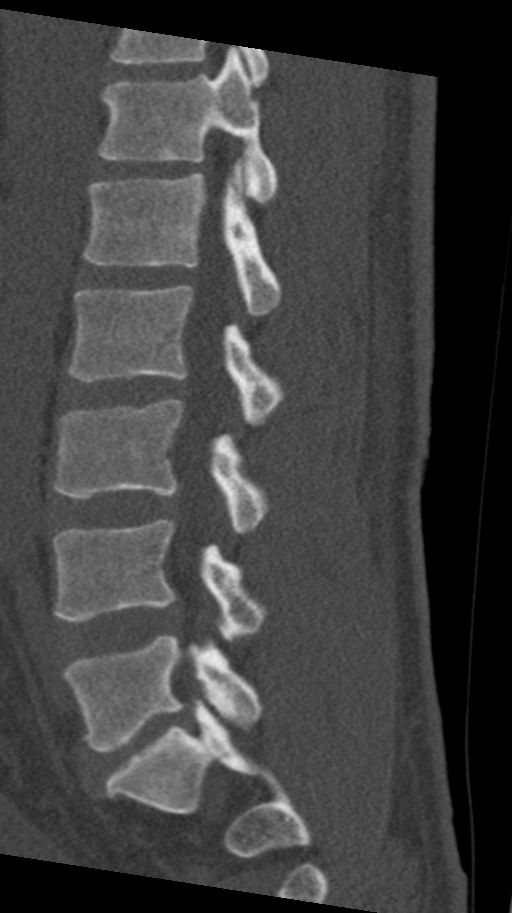
[im 44/66  bone]
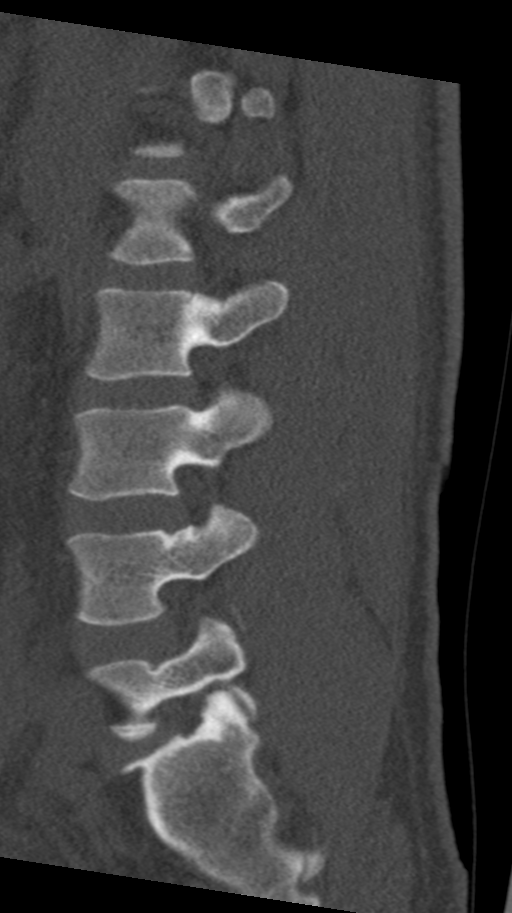

[9 of 35 positions shown; findings below may reference images not displayed]

FINDINGS: Segmentation: 5 lumbar type vertebrae.

Alignment: Mild retrolisthesis of L5. Straightening of the lumbar
spine.

Vertebrae: No acute fracture or focal pathologic process.

Paraspinal and other soft tissues: Negative.

Other: None

Disc spaces: Mild multilevel degenerative disc disease prominent at
L5-S1.

T12-L1: No significant finding.

L1-L2: No significant findings.

L2-L3: No significant findings.

L3-L4: Mild circumferential disc bulge with mild narrowing left
lateral recess. No significant neural foraminal narrowing.

L4-L5: Circumferential disc bulge and facet joint arthropathy. Mild
narrowing of spinal canal. No significant neural foraminal
narrowing.

L5-S1: Disc osteophyte complex with moderate spinal canal narrowing.
Moderate facet joint arthropathy. Mild-to-moderate bilateral neural
foraminal narrowing.
IMPRESSION: 1.  No evidence of fracture.

2. Degenerate disc disease prominent at L5-S1 with mild-to-moderate
bilateral neural foraminal narrowing and marked associated facet
joint arthropathy.

Paraspinal soft tissues are within normal limits.

## 2024-01-28 ENCOUNTER — Ambulatory Visit (INDEPENDENT_AMBULATORY_CARE_PROVIDER_SITE_OTHER): Admitting: Family Medicine

## 2024-01-28 VITALS — Ht 67.0 in | Wt 210.0 lb

## 2024-01-28 DIAGNOSIS — M5416 Radiculopathy, lumbar region: Secondary | ICD-10-CM

## 2024-01-28 NOTE — Patient Instructions (Signed)
 We will go ahead with an epidural steroid injection on the left side for your pain going into your leg. Call or message me a week after this to let me know how you're doing. We can consider repeating this, starting physical therapy depending on your progress.

## 2024-01-29 ENCOUNTER — Encounter: Payer: Self-pay | Admitting: Family Medicine

## 2024-01-29 NOTE — Progress Notes (Signed)
 MRI reviewed and discussed with patient.  He does have narrowing at L5-S1 bilaterally with possible L5 radiculopathy.  Does not look severe enough to account for the weakness in his leg or distribution but could account for pain.  And his pain could be limiting his strength testing.  Advised we go ahead with epidural steroid injection here - call us  in a week with how he's doing.  Consider repeat injection if not enough relief, physical therapy if improving.

## 2024-03-03 ENCOUNTER — Other Ambulatory Visit: Payer: Self-pay | Admitting: Family Medicine

## 2024-05-12 ENCOUNTER — Ambulatory Visit: Admitting: Family Medicine

## 2024-05-12 VITALS — BP 124/86 | Ht 67.0 in | Wt 210.0 lb

## 2024-05-12 DIAGNOSIS — R200 Anesthesia of skin: Secondary | ICD-10-CM | POA: Diagnosis not present

## 2024-05-12 DIAGNOSIS — R202 Paresthesia of skin: Secondary | ICD-10-CM | POA: Diagnosis not present

## 2024-05-12 MED ORDER — METHOCARBAMOL 500 MG PO TABS: 500.0000 mg | ORAL_TABLET | Freq: Three times a day (TID) | ORAL | 1 refills | Status: AC | PRN

## 2024-05-12 MED ORDER — CELECOXIB 200 MG PO CAPS: 200.0000 mg | ORAL_CAPSULE | Freq: Two times a day (BID) | ORAL | 2 refills | Status: AC

## 2024-05-12 NOTE — Progress Notes (Addendum)
 PCP: Emerick Avelina POUR, PA-C  Subjective:   HPI: Patient is a 42 y.o. male here for low back and left leg pain with numbness and tingling of all of his extremities that started around October 2024. He was seen here 01/07/24 and given Celebrex , Robaxin  PRN and lumbar MRI was obtained. MRI lumbar spine showed narrowing at L5-S1 with possible L5 radiculopathy, he was referred to IR for epidural steroid injections but did not get called to schedule.  Since then, he continues to have low back pain but primarily concerned with pain and tingling in his legs.  He states that the tingling was initially down his left leg but 2 weeks ago it began going down his right leg.  He has to roll out of bed in the morning due to his back and leg pain.  He also endorses tingling in his bilateral hands that radiates to his bilateral elbows.  Patient feels like his walking has changed due to his left lower extremity weakness.  He has had minimal improvement with Celebrex  and Robaxin . He does not drink alcohol.  He does not have a PCP, is scheduled to see one in November.  Past Medical History:  Diagnosis Date   Back pain    Headache     No current outpatient medications on file prior to visit.   No current facility-administered medications on file prior to visit.    No past surgical history on file.  Allergies  Allergen Reactions   Bee Venom Anaphylaxis   Bee Venom Anaphylaxis   Bactrim  [Sulfamethoxazole -Trimethoprim ]     Localized drug reaction    BP 124/86   Ht 5' 7 (1.702 m)   Wt 210 lb (95.3 kg)   BMI 32.89 kg/m       No data to display              No data to display              Objective:  Physical Exam:  Gen: NAD, comfortable in exam room Back: No obvious deformity or scoliosis. No skin changes.  Right lumbar paraspinal muscle tenderness to palpation.  No midline tenderness to palpation.  Full range of motion without pain.  4 out of 5 strength left lower extremity except 5/5  hip flexion, 5 out of 5 strength right lower extremity.  Symmetric handgrip bilaterally.  DTRs 3+.  Negative straight leg raises.  Sensation intact bilaterally all extremities.  Left foot supination with ambulation.   Assessment & Plan:  1. Numbness and tingling of BUE/BLE Given his symptoms are in all extremities do not suspect that this neuropathy is coming from his back.  Will plan to assess for B12 deficiency, electrolyte abnormalities, CRP and diabetes given the distribution of his symptoms. Will hold off on epidural steroid injections for now until labwork results as this does not appear to be stemming from his back. Discussed PO steroids but pt declines.  - Referral to neurology placed - Continue Celebrex , Robaxin  as needed - F/u after labs result

## 2024-05-12 NOTE — Patient Instructions (Addendum)
 We will check some labwork (CRP, B12, BMP) - get this done today if you can. We will refer you to neurology for evaluation of these symptoms and further workup. Take the celebrex  with methocarbamol  as needed. Next steps will depend on the labwork and neurology's evaluation. I don't think an injection in the back will help with all these symptoms. We can consider prednisone  dose pack - you've had this in the past.

## 2024-05-13 ENCOUNTER — Encounter: Payer: Self-pay | Admitting: Neurology

## 2024-05-15 ENCOUNTER — Ambulatory Visit: Payer: Self-pay | Admitting: Family Medicine

## 2024-05-15 LAB — BASIC METABOLIC PANEL WITH GFR
BUN/Creatinine Ratio: 10 (ref 9–20)
BUN: 10 mg/dL (ref 6–24)
CO2: 21 mmol/L (ref 20–29)
Calcium: 8.8 mg/dL (ref 8.7–10.2)
Chloride: 102 mmol/L (ref 96–106)
Creatinine, Ser: 1.05 mg/dL (ref 0.76–1.27)
Glucose: 111 mg/dL — ABNORMAL HIGH (ref 70–99)
Potassium: 4.8 mmol/L (ref 3.5–5.2)
Sodium: 136 mmol/L (ref 134–144)
eGFR: 90 mL/min/1.73 (ref >59)

## 2024-05-15 LAB — VITAMIN B12: Vitamin B-12: 284 pg/mL (ref 232–1245)

## 2024-05-15 LAB — C-REACTIVE PROTEIN: CRP: 2 mg/L (ref 0–10)

## 2024-05-16 ENCOUNTER — Other Ambulatory Visit: Payer: Self-pay

## 2024-05-16 DIAGNOSIS — R200 Anesthesia of skin: Secondary | ICD-10-CM

## 2024-05-30 LAB — HOMOCYSTEINE: Homocysteine: 6.8 umol/L (ref 0.0–14.5)

## 2024-05-30 LAB — METHYLMALONIC ACID, SERUM: Methylmalonic Acid: 162 nmol/L (ref 0–378)

## 2024-05-31 ENCOUNTER — Ambulatory Visit (HOSPITAL_COMMUNITY)
Admission: EM | Admit: 2024-05-31 | Discharge: 2024-05-31 | Disposition: A | Discharge status: Home / Self Care | Attending: Family Medicine | Admitting: Family Medicine

## 2024-05-31 ENCOUNTER — Encounter (HOSPITAL_COMMUNITY): Payer: Self-pay

## 2024-05-31 DIAGNOSIS — K047 Periapical abscess without sinus: Secondary | ICD-10-CM | POA: Diagnosis not present

## 2024-05-31 MED ORDER — KETOROLAC TROMETHAMINE 10 MG PO TABS: 10.0000 mg | ORAL_TABLET | Freq: Four times a day (QID) | ORAL | 0 refills | Status: AC | PRN

## 2024-05-31 MED ORDER — AMOXICILLIN-POT CLAVULANATE 875-125 MG PO TABS: 1.0000 | ORAL_TABLET | Freq: Two times a day (BID) | ORAL | 0 refills | Status: AC

## 2024-05-31 MED ORDER — KETOROLAC TROMETHAMINE 30 MG/ML IJ SOLN
30.0000 mg | Freq: Once | INTRAMUSCULAR | Status: AC
  Administered 2024-05-31: 30 mg via INTRAMUSCULAR

## 2024-05-31 MED ORDER — KETOROLAC TROMETHAMINE 30 MG/ML IJ SOLN
INTRAMUSCULAR | Status: AC
  Filled 2024-05-31: qty 1

## 2024-05-31 NOTE — ED Provider Notes (Signed)
 [facility]    CSN: [id number] Arrival date & time: 05/31/24  1002      History   Chief Complaint Chief Complaint  Patient presents with   Dental Pain    HPI Stephen Ward is a 43 y.o. male.    Dental Pain  Here for pain in his left upper teeth.  This began about 1 week ago.  No fever or chills.  Has been hurting to chew on that side.  He is allergic to sulfa .  Last eGFR was 90 in September of this year  He had been taking Celebrex  for other problems but has not taken that in about 2 days.(Apparently this has not given him a rash, though he has a sulfa  allergy?)  Past Medical History:  Diagnosis Date   Back pain    Headache     Patient Active Problem List   Diagnosis Date Noted   Renal vein laceration, right, initial encounter 09/02/2017    History reviewed. No pertinent surgical history.     Home Medications    Prior to Admission medications   Medication Sig Start Date End Date Taking? Authorizing Provider  amoxicillin -clavulanate (AUGMENTIN ) 875-125 MG tablet Take 1 tablet by mouth 2 (two) times daily for 7 days. 05/31/24 06/07/24 Yes Zuleyka Kloc, Sharlet POUR, MD  celecoxib  (CELEBREX ) 200 MG capsule Take 1 capsule (200 mg total) by mouth 2 (two) times daily. 05/12/24  Yes Hudnall, Ludie SAUNDERS, MD  ketorolac  (TORADOL ) 10 MG tablet Take 1 tablet (10 mg total) by mouth every 6 (six) hours as needed (pain). 05/31/24  Yes Vonna Sharlet POUR, MD  methocarbamol  (ROBAXIN ) 500 MG tablet Take 1 tablet (500 mg total) by mouth every 8 (eight) hours as needed. 05/12/24  Yes Hudnall, Ludie SAUNDERS, MD    Family History Family History  Problem Relation Age of Onset   Seizures Mother    Healthy Father     Social History Social History   Tobacco Use   Smoking status: Every Day    Current packs/day: 0.50    Types: Cigarettes   Smokeless tobacco: Never  Vaping Use   Vaping status: Every Day  Substance Use Topics   Alcohol use: No   Drug use: No     Allergies    Bee venom, Bee venom, and Bactrim  [sulfamethoxazole -trimethoprim ]   Review of Systems Review of Systems   Physical Exam Triage Vital Signs ED Triage Vitals  Encounter Vitals Group     BP 05/31/24 1014 111/75     Girls Systolic BP Percentile --      Girls Diastolic BP Percentile --      Boys Systolic BP Percentile --      Boys Diastolic BP Percentile --      Pulse Rate 05/31/24 1014 65     Resp 05/31/24 1014 17     Temp 05/31/24 1014 98.3 F (36.8 C)     Temp Source 05/31/24 1014 Oral     SpO2 05/31/24 1014 96 %     Weight 05/31/24 1013 210 lb (95.3 kg)     Height 05/31/24 1013 5' 7 (1.702 m)     Head Circumference --      Peak Flow --      Pain Score 05/31/24 1012 8     Pain Loc --      Pain Education --      Exclude from Growth Chart --    No data found.  Updated Vital Signs BP 111/75 (BP Location:  Right Arm)   Pulse 65   Temp 98.3 F (36.8 C) (Oral)   Resp 17   Ht 5' 7 (1.702 m)   Wt 95.3 kg   SpO2 96%   BMI 32.89 kg/m   Visual Acuity Right Eye Distance:   Left Eye Distance:   Bilateral Distance:    Right Eye Near:   Left Eye Near:    Bilateral Near:     Physical Exam Vitals reviewed.  Constitutional:      General: He is not in acute distress.    Appearance: He is not ill-appearing, toxic-appearing or diaphoretic.  HENT:     Mouth/Throat:     Mouth: Mucous membranes are moist.     Comments: There is dental caries noted in the left posterior molars on the upper dental ridge.  There is a little bit of erythema and swelling of the buccal mucosa and the lateral gum of the upper dental ridge on the left. Eyes:     Extraocular Movements: Extraocular movements intact.     Pupils: Pupils are equal, round, and reactive to light.  Skin:    Coloration: Skin is not pale.  Neurological:     Mental Status: He is alert and oriented to person, place, and time.  Psychiatric:        Behavior: Behavior normal.      UC Treatments / Results  Labs (all  labs ordered are listed, but only abnormal results are displayed) Labs Reviewed - No data to display  EKG   Radiology No results found.  Procedures Procedures (including critical care time)  Medications Ordered in UC Medications  ketorolac  (TORADOL ) 30 MG/ML injection 30 mg (has no administration in time range)    Initial Impression / Assessment and Plan / UC Course  I have reviewed the triage vital signs and the nursing notes.  Pertinent labs & imaging results that were available during my care of the patient were reviewed by me and considered in my medical decision making (see chart for details).     Augmentin  is sent in for the dental infection.  Toradol  injection is given for the pain and Toradol  tablets are sent to the pharmacy.  He is given a list of low-cost dental providers.  We have discussed that he should not take the Celebrex  while he is taking the ketorolac  tablets Final Clinical Impressions(s) / UC Diagnoses   Final diagnoses:  Dental infection     Discharge Instructions      You have been given a shot of Toradol  30 mg today.  Ketorolac  10 mg tablets--take 1 tablet every 6 hours as needed for pain.  This is the same medicine that is in the shot we just gave you  Do not take Celebrex /celecoxib  while taking the ketorolac  tablets  Take amoxicillin -clavulanate 875 mg--1 tab twice daily with food for 7 days       ED Prescriptions     Medication Sig Dispense Auth. Provider   amoxicillin -clavulanate (AUGMENTIN ) 875-125 MG tablet Take 1 tablet by mouth 2 (two) times daily for 7 days. 14 tablet Lucynda Rosano K, MD   ketorolac  (TORADOL ) 10 MG tablet Take 1 tablet (10 mg total) by mouth every 6 (six) hours as needed (pain). 20 tablet Henderson Frampton K, MD      PDMP not reviewed this encounter.   Vonna Sharlet POUR, MD 05/31/24 1031

## 2024-05-31 NOTE — Discharge Instructions (Signed)
 You have been given a shot of Toradol  30 mg today.  Ketorolac  10 mg tablets--take 1 tablet every 6 hours as needed for pain.  This is the same medicine that is in the shot we just gave you  Do not take Celebrex /celecoxib  while taking the ketorolac  tablets  Take amoxicillin -clavulanate 875 mg--1 tab twice daily with food for 7 days

## 2024-05-31 NOTE — ED Triage Notes (Signed)
 Pt states that he has upper, left sided dental pain. X1 week

## 2024-06-03 ENCOUNTER — Ambulatory Visit: Payer: Self-pay | Admitting: Family Medicine

## 2024-06-23 ENCOUNTER — Ambulatory Visit: Admitting: Family Medicine

## 2024-06-23 ENCOUNTER — Encounter: Payer: Self-pay | Admitting: Family Medicine

## 2024-06-23 VITALS — BP 136/88 | Ht 67.0 in | Wt 212.0 lb

## 2024-06-23 DIAGNOSIS — M5412 Radiculopathy, cervical region: Secondary | ICD-10-CM | POA: Diagnosis present

## 2024-06-23 NOTE — Progress Notes (Signed)
 PCP: Emerick Avelina POUR, PA-C  Patient is a 43 y.o. male here for neck and shoulder pain.  HPI This has been a problem for some time but is worsening now. Pain in left neck and shoulder, does radiate down arm to elbow. Sometimes also has numbness/tingling in left arm down to fingers. Decreased ROM of left shoulder, cannot complete daily tasks with as much ease as he once could. Has been out of work due to these symptoms. He also notes intermittent numbness/tingling down right arm, but no pain.  Of note, he has had similar leg pain in the recent past with labs showing borderline B12 deficiency (follow up homocysteine, methylmalonic acid normal) and MRI Lumbar Spine showing degenerative disc disease and moderate bilateral foraminal narrowing.  Scheduled to see Neurology in December.  Past Medical History:  Diagnosis Date   Back pain    Headache     Current Outpatient Medications on File Prior to Visit  Medication Sig Dispense Refill   celecoxib  (CELEBREX ) 200 MG capsule Take 1 capsule (200 mg total) by mouth 2 (two) times daily. 60 capsule 2   ketorolac  (TORADOL ) 10 MG tablet Take 1 tablet (10 mg total) by mouth every 6 (six) hours as needed (pain). 20 tablet 0   methocarbamol  (ROBAXIN ) 500 MG tablet Take 1 tablet (500 mg total) by mouth every 8 (eight) hours as needed. 60 tablet 1   No current facility-administered medications on file prior to visit.    No past surgical history on file.  Allergies  Allergen Reactions   Bee Venom Anaphylaxis   Bee Venom Anaphylaxis   Bactrim  [Sulfamethoxazole -Trimethoprim ]     Localized drug reaction    BP 136/88   Ht 5' 7 (1.702 m)   Wt 212 lb (96.2 kg)   BMI 33.20 kg/m       No data to display              No data to display              Objective:  Physical Exam: Gen: Alert, NAD, comfortable in exam room Neck: Supple, no LAD, +Spurling's, +Neck Distraction test. Pain with flexion. Hypertonic musculature on left side with  tenderness to palpation left trapezius and cervical paraspinal muscles. Decreased range of motion all directions.  Right Shoulder Exam No gross deformity, ecchymoses. Tenderness on palpation, particularly to anterior shoulder. Decreased active ROM particularly in abduction. Passive ROM intact but painful. Entire right UE is neurovascularly intact.  Right Elbow Exam No notable deformity, effusion or erythema. FROM with normal strength.    Hand Exam Strength intact bilaterally. Sensation intact to light touch bilaterally.  Assessment and Plan:  Neck and Left Shoulder pain - exam consistent with cervical radiculopathy. Anticipate he would not be able to participate in, or benefit from, PT at this time given the severity of his discomfort. Recommend Cervical MRI for further evaluation. He will also start steroid dose pack. He has some robaxin  left over at home and will take that PRN up to 3x daily.  Consider ESI depending on MRI results.

## 2024-06-23 NOTE — Patient Instructions (Signed)
 You have cervical radiculopathy (a pinched nerve in the neck). Prednisone  6 day dose pack to relieve irritation/inflammation of the nerve. Don't take aleve  or ibuprofen  while on the prednisone . Robaxin  three times a day as needed for muscle spasms (do not drive with this if it makes you sleepy). Simple range of motion exercises within limits of pain to prevent further stiffness. Heat 15 minutes at a time 3-4 times a day to help with spasms. Watch head position when on computers, texting, when sleeping in bed - should in line with back to prevent further nerve traction and irritation. We will go ahead with an MRI  Call me in 1 week to let me know how you're doing.

## 2024-06-25 ENCOUNTER — Encounter: Payer: Self-pay | Admitting: Family Medicine

## 2024-06-30 ENCOUNTER — Ambulatory Visit
Admission: RE | Admit: 2024-06-30 | Discharge: 2024-06-30 | Disposition: A | Discharge status: Home / Self Care | Source: Ambulatory Visit | Attending: Family Medicine | Admitting: Family Medicine

## 2024-06-30 DIAGNOSIS — M5412 Radiculopathy, cervical region: Secondary | ICD-10-CM

## 2024-07-03 ENCOUNTER — Other Ambulatory Visit: Payer: Self-pay

## 2024-07-03 ENCOUNTER — Ambulatory Visit: Payer: Self-pay | Admitting: Family Medicine

## 2024-07-03 DIAGNOSIS — M5412 Radiculopathy, cervical region: Secondary | ICD-10-CM

## 2024-07-09 ENCOUNTER — Other Ambulatory Visit: Payer: Self-pay | Admitting: Family Medicine

## 2024-07-09 ENCOUNTER — Encounter: Payer: Self-pay | Admitting: Family Medicine

## 2024-07-09 DIAGNOSIS — M5412 Radiculopathy, cervical region: Secondary | ICD-10-CM

## 2024-07-10 ENCOUNTER — Ambulatory Visit
Admission: RE | Admit: 2024-07-10 | Discharge: 2024-07-10 | Disposition: A | Discharge status: Home / Self Care | Source: Ambulatory Visit | Attending: Family Medicine | Admitting: Family Medicine

## 2024-07-10 DIAGNOSIS — M5412 Radiculopathy, cervical region: Secondary | ICD-10-CM

## 2024-07-10 MED ORDER — TRIAMCINOLONE ACETONIDE 40 MG/ML IJ SUSP (RADIOLOGY)
60.0000 mg | Freq: Once | INTRAMUSCULAR | Status: AC
  Administered 2024-07-10: 60 mg via EPIDURAL

## 2024-07-10 MED ORDER — IOPAMIDOL (ISOVUE-M 300) INJECTION 61%
1.0000 mL | Freq: Once | INTRAMUSCULAR | Status: AC | PRN
  Administered 2024-07-10: 1 mL via EPIDURAL

## 2024-07-10 NOTE — Discharge Instructions (Signed)

## 2024-08-04 ENCOUNTER — Ambulatory Visit: Admitting: Neurology

## 2024-08-04 ENCOUNTER — Encounter: Payer: Self-pay | Admitting: Neurology

## 2024-08-04 VITALS — BP 111/77 | HR 79 | Ht 67.0 in | Wt 211.6 lb

## 2024-08-04 DIAGNOSIS — M79602 Pain in left arm: Secondary | ICD-10-CM

## 2024-08-04 DIAGNOSIS — M5417 Radiculopathy, lumbosacral region: Secondary | ICD-10-CM | POA: Diagnosis not present

## 2024-08-04 NOTE — Progress Notes (Signed)
 [facility] Clinic Note - Initial Visit   Date: 08/04/2024   RAYSHUN KANDLER MRN: [id number] DOB: [birth year]   Dear Dr. Cleatrice:  Thank you for your kind referral of Stephen Ward for consultation of paresthesias. Although his history is well known to you, please allow us  to reiterate it for the purpose of our medical record. The patient was accompanied to the clinic by self.    KAINOAH BARTOSIEWICZ is a 43 y.o. right-handed male with chronic low back pain presenting for evaluation of numbness/tingling.   IMPRESSION/PLAN: Assessment & Plan Cervical and lumbosacral radiculopathy.  MRI was reviewed which shows multilevel degenerative changes with possibly symptomatic left C6 radiculopathy and bilateral L5-S1 radiculopathy.  These findings would not explain all of his diffuse pain or groin pain.  To further evaluate his symptoms, NCS/EMG will be ordered of the left arm and leg.  I will also refer him to PT.   Further recommendations pending results.  ------------------------------------------------------------- History of present illness:  Discussed the use of AI scribe software for clinical note transcription with the patient, who gave verbal consent to proceed.  History of Present Illness Stephen Ward is a 43 year old male who presents with numbness and tingling in his hands and legs.   He has been experiencing numbness and tingling in both hands, worse on the left.  Symptoms in the right hand started two days ago and has been present for several months on the left.  These sensations are also present in his knees and legs, described as a 'stabbing' pain that persists even during numbness. The numbness occurs approximately three days a week, lasting about 30 seconds to a minute. Symptoms are more pronounced during activities like cooking and are less frequent at rest.  He reports feeling vibration and cold sensations in his hands and legs, with no specific  triggers for the numbness and tingling.  He has not undergone physical therapy.  He experiences sharp, radiating pain in his groin area.  Additionally, he reports neck pain and had ESI which did not provide relief.  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 He smokes and does not consume alcohol.    Out-side paper records, electronic medical record, and images have been reviewed where available and summarized as:  MRI cervical spine wo contrast 08/04/2024: 1. Multilevel cervical spondylosis with resultant mild spinal stenosis at C4-5. Mild cord flattening at this level without cord signal changes. 2. Multifactorial degenerative changes with resultant multilevel foraminal narrowing as above. Notable findings include mild right worse than left C4 foraminal stenosis, moderate right with mild left C5 foraminal narrowing, with severe left C6 foraminal stenosis.  MRI lumbar spine o contrast 01/18/2024:  Relatively stable degenerative disc disease resulting in effacement of the ventral thecal sac and crowding and mild abutment of the descending S1 nerve roots in the lateral recess, right greater than left.   There is stable moderate bilateral foraminal narrowing when compared to prior examination. Correlation for L5 radiculopathy.   Lab Results  Component Value Date   VITAMINB12 284 05/14/2024    Past Medical History:  Diagnosis Date   Back pain    Headache    Hx of migraines     No past surgical history on file.   Medications:  Outpatient Encounter Medications as of 08/04/2024  Medication Sig   celecoxib  (CELEBREX ) 200 MG capsule Take 1  capsule (200 mg total) by mouth 2 (two) times daily.   gabapentin (NEURONTIN) 300 MG capsule Take 300 mg by mouth at bedtime.   ketorolac  (TORADOL ) 10 MG tablet Take 1 tablet (10 mg total) by mouth every 6 (six) hours as needed (pain).   methocarbamol   (ROBAXIN ) 500 MG tablet Take 1 tablet (500 mg total) by mouth every 8 (eight) hours as needed.   fenofibrate (TRICOR) 145 MG tablet Take 145 mg by mouth daily. (Patient not taking: Reported on 08/04/2024)   No facility-administered encounter medications on file as of 08/04/2024.    Allergies: Allergies[1]  Family History: Family History  Problem Relation Age of Onset   Healthy Father    Seizures Maternal Grandfather     Social History: Social History[2] Social History   Social History Narrative   ** Merged History Encounter **        Vital Signs:  BP 111/77   Pulse 79   Ht 5' 7 (1.702 m)   Wt 211 lb 9.6 oz (96 kg)   SpO2 98%   BMI 33.14 kg/m   Neurological Exam: MENTAL STATUS including orientation to time, place, person, recent and remote memory, attention span and concentration, language, and fund of knowledge is normal.  Speech is not dysarthric.  CRANIAL NERVES: II:  No visual field defects.     III-IV-VI: Pupils equal round and reactive to light.  Normal conjugate, extra-ocular eye movements in all directions of gaze.  No nystagmus.  No ptosis.   V:  Normal facial sensation.    VII:  Normal facial symmetry and movements.   VIII:  Normal hearing and vestibular function.   IX-X:  Normal palatal movement.   XI:  Normal shoulder shrug and head rotation.   XII:  Normal tongue strength and range of motion, no deviation or fasciculation.  MOTOR:  No atrophy, fasciculations or abnormal movements.  No pronator drift.   Upper Extremity:  Right  Left  Deltoid  5/5   5/5   Biceps  5/5   5/5   Triceps  5/5   5/5   Wrist extensors  5/5   5/5   Wrist flexors  5/5   5/5   Finger extensors  5/5   5/5   Finger flexors  5/5   5/5   Dorsal interossei  5/5   5/5   Abductor pollicis  5/5   5/5   Tone (Ashworth scale)  0  0   Lower Extremity:  Right  Left  Hip flexors  5/5   5/5   Knee flexors  5/5   5/5   Knee extensors  5/5   5/5   Dorsiflexors  5/5   5/5    Plantarflexors  5/5   5/5   Toe extensors  5/5   5/5   Toe flexors  5/5   5/5   Tone (Ashworth scale)  0  0   MSRs:                                           Right        Left brachioradialis 2+  2+  biceps 2+  2+  triceps 2+  2+  patellar 2+  2+  ankle jerk 2+  2+  Hoffman no  no  plantar response down  down   SENSORY:  Normal and symmetric perception of light  touch, pinprick, temperature, and vibration.  Tinel's sign absent at the wrists bilaterally.  COORDINATION/GAIT: Normal finger-to- nose-finger.  Intact rapid alternating movements bilaterally.  Able to rise from a chair without using arms.  Gait mildly wide-based, stable, unassisted.    Thank you for allowing me to participate in patient's care.  If I can answer any additional questions, I would be pleased to do so.    Sincerely,    Afsana Liera K. Zakarie Sturdivant, DO     [1]  Allergies Allergen Reactions   Bee Venom Anaphylaxis   Bee Venom Anaphylaxis   Bactrim  [Sulfamethoxazole -Trimethoprim ]     Localized drug reaction  [2]  Social History Tobacco Use   Smoking status: Every Day    Current packs/day: 0.50    Types: Cigarettes   Smokeless tobacco: Never  Vaping Use   Vaping status: Every Day  Substance Use Topics   Alcohol use: No   Drug use: No

## 2024-08-04 NOTE — Patient Instructions (Addendum)
 Nerve testing left arm and leg  Referral to start physical therapy  ELECTROMYOGRAM AND NERVE CONDUCTION STUDIES (EMG/NCS) INSTRUCTIONS  How to Prepare The neurologist conducting the EMG will need to know if you have certain medical conditions. Tell the neurologist and other EMG lab personnel if you: Have a pacemaker or any other electrical medical device Take blood-thinning medications Have hemophilia, a blood-clotting disorder that causes prolonged bleeding Bathing Take a shower or bath shortly before your exam in order to remove oils from your skin. Dont apply lotions or creams before the exam.  What to Expect Youll likely be asked to change into a hospital gown for the procedure and lie down on an examination table. The following explanations can help you understand what will happen during the exam.  Electrodes. The neurologist or a technician places surface electrodes at various locations on your skin depending on where youre experiencing symptoms. Or the neurologist may insert needle electrodes at different sites depending on your symptoms.  Sensations. The electrodes will at times transmit a tiny electrical current that you may feel as a twinge or spasm. The needle electrode may cause discomfort or pain that usually ends shortly after the needle is removed. If you are concerned about discomfort or pain, you may want to talk to the neurologist about taking a short break during the exam.  Instructions. During the needle EMG, the neurologist will assess whether there is any spontaneous electrical activity when the muscle is at rest - activity that isnt present in healthy muscle tissue - and the degree of activity when you slightly contract the muscle.  He or she will give you instructions on resting and contracting a muscle at appropriate times. Depending on what muscles and nerves the neurologist is examining, he or she may ask you to change positions during the exam.  After your EMG You  may experience some temporary, minor bruising where the needle electrode was inserted into your muscle. This bruising should fade within several days. If it persists, contact your primary care doctor.

## 2024-08-18 NOTE — Therapy (Deleted)
 " OUTPATIENT PHYSICAL THERAPY CERVICAL EVALUATION   Patient Name: Stephen Ward MRN: [id number] DOB:08-14-1981, 43 y.o., male Today's Date: 08/18/2024  END OF SESSION:   Past Medical History:  Diagnosis Date   Back pain    Headache    Hx of migraines    No past surgical history on file. Patient Active Problem List   Diagnosis Date Noted   Renal vein laceration, right, initial encounter 09/02/2017    PCP: Emerick Avelina POUR, PA-C PCP - General  REFERRING PROVIDER: Patel, Donika K, DO  REFERRING DIAG: M54.17 (ICD-10-CM) - Lumbosacral radiculopathy M79.602 (ICD-10-CM) - Left arm pain  THERAPY DIAG:  No diagnosis found.  Rationale for Evaluation and Treatment: Rehabilitation  ONSET DATE: chronic  SUBJECTIVE:                                                                                                                                                                                                         SUBJECTIVE STATEMENT: Stephen Ward is a 43 year old male who presents with numbness and tingling in his hands and legs.    He has been experiencing numbness and tingling in both hands, worse on the left.  Symptoms in the right hand started two days ago and has been present for several months on the left.  These sensations are also present in his knees and legs, described as a 'stabbing' pain that persists even during numbness. The numbness occurs approximately three days a week, lasting about 30 seconds to a minute. Symptoms are more pronounced during activities like cooking and are less frequent at rest.  He reports feeling vibration and cold sensations in his hands and legs, with no specific triggers for the numbness and tingling.  He has not undergone physical therapy.   He experiences sharp, radiating pain in his groin area.  Additionally, he reports neck pain and had ESI which did not provide relief. Hand dominance: {MISC; OT HAND [phone]}  PERTINENT  HISTORY:  ***  PAIN:  Are you having pain? Yes: NPRS scale: *** Pain location: *** Pain description: *** Aggravating factors: *** Relieving factors: ***  PRECAUTIONS: None  RED FLAGS: None     WEIGHT BEARING RESTRICTIONS: No  FALLS:  Has patient fallen in last 6 months? No   OCCUPATION: not working  PLOF: Independent  PATIENT GOALS: To manage my neck and back symptoms  NEXT MD VISIT: TBD  OBJECTIVE:  Note: Objective measures were completed at Evaluation unless otherwise noted.  DIAGNOSTIC FINDINGS:  MRI lumbar spine o contrast 01/18/2024:  Relatively stable  degenerative disc disease resulting in effacement of the ventral thecal sac and crowding and mild abutment of the descending S1 nerve roots in the lateral recess, right greater than left.   There is stable moderate bilateral foraminal narrowing when compared to prior examination. Correlation for L5 radiculopathy.  MRI cervical spine wo contrast 08/04/2024: 1. Multilevel cervical spondylosis with resultant mild spinal stenosis at C4-5. Mild cord flattening at this level without cord signal changes. 2. Multifactorial degenerative changes with resultant multilevel foraminal narrowing as above. Notable findings include mild right worse than left C4 foraminal stenosis, moderate right with mild left C5 foraminal narrowing, with severe left C6 foraminal stenosis.  PATIENT SURVEYS:  {rehab surveys:24030}  POSTURE: {posture:25561}  PALPATION: ***   CERVICAL ROM:   {AROM/PROM:27142} ROM A/PROM (deg) eval  Flexion   Extension   Right lateral flexion   Left lateral flexion   Right rotation   Left rotation    (Blank rows = not tested)  LUMBAR ROM:   {AROM/PROM:27142}  A/PROM  eval  Flexion   Extension   Right lateral flexion   Left lateral flexion   Right rotation   Left rotation    (Blank rows = not tested)   UPPER EXTREMITY ROM:  {AROM/PROM:27142} ROM Right eval Left eval  Shoulder  flexion    Shoulder extension    Shoulder abduction    Shoulder adduction    Shoulder extension    Shoulder internal rotation    Shoulder external rotation    Elbow flexion    Elbow extension    Wrist flexion    Wrist extension    Wrist ulnar deviation    Wrist radial deviation    Wrist pronation    Wrist supination     (Blank rows = not tested)  LOWER EXTREMITY ROM:     {AROM/PROM:27142}  Right eval Left eval  Hip flexion    Hip extension    Hip abduction    Hip adduction    Hip internal rotation    Hip external rotation    Knee flexion    Knee extension    Ankle dorsiflexion    Ankle plantarflexion    Ankle inversion    Ankle eversion     (Blank rows = not tested)   UPPER EXTREMITY MMT:  MMT Right eval Left eval  Shoulder flexion    Shoulder extension    Shoulder abduction    Shoulder adduction    Shoulder extension    Shoulder internal rotation    Shoulder external rotation    Middle trapezius    Lower trapezius    Elbow flexion    Elbow extension    Wrist flexion    Wrist extension    Wrist ulnar deviation    Wrist radial deviation    Wrist pronation    Wrist supination    Grip strength     (Blank rows = not tested)  LOWER EXTREMITY MMT:    MMT Right eval Left eval  Hip flexion    Hip extension    Hip abduction    Hip adduction    Hip internal rotation    Hip external rotation    Knee flexion    Knee extension    Ankle dorsiflexion    Ankle plantarflexion    Ankle inversion    Ankle eversion     (Blank rows = not tested)   CERVICAL SPECIAL TESTS:  {Cervical special tests:25246}  LUMBAR SPECIAL TESTS:  {lumbar special test:25242}  FUNCTIONAL TESTS:  30 seconds chair stand test  TREATMENT:                                                                                                                            [facility] Adult PT Treatment:                                                DATE: *** Eval and HEP Self  Care: Additional minutes spent for educating on updated Therapeutic Home Exercise Program as well as comparing current status to condition at start of symptoms. This included exercises focusing on stretching, strengthening, with focus on eccentric aspects. Long term goals include an improvement in range of motion, strength, endurance as well as avoiding reinjury. Patient's frequency would include in 1-2 times a day, 3-5 times a week for a duration of 6-12 weeks. Proper technique shown and discussed handout in great detail. All questions were discussed and addressed.      PATIENT EDUCATION:  Education details: Discussed eval findings, rehab rationale and POC and patient is in agreement  Person educated: Patient Education method: Explanation and Handouts Education comprehension: verbalized understanding and needs further education  HOME EXERCISE PROGRAM: ***  ASSESSMENT:  CLINICAL IMPRESSION: Patient is a *** y.o. *** who was seen today for physical therapy evaluation and treatment for ***.   OBJECTIVE IMPAIRMENTS: {opptimpairments:25111}.   ACTIVITY LIMITATIONS: {activitylimitations:27494}  PERSONAL FACTORS: {Personal factors:25162} are also affecting patient's functional outcome.   REHAB POTENTIAL: Fair ***  CLINICAL DECISION MAKING: Evolving/moderate complexity  EVALUATION COMPLEXITY: High   GOALS: Goals reviewed with patient? No  SHORT TERM GOALS: Target date: ***  Patient to demonstrate independence in HEP  Baseline:  Goal status: INITIAL  2.  *** Baseline:  Goal status: INITIAL  3.  *** Baseline:  Goal status: INITIAL  4.  *** Baseline:  Goal status: INITIAL  5.  *** Baseline:  Goal status: INITIAL  6.  *** Baseline:  Goal status: INITIAL  LONG TERM GOALS: Target date: ***  Patient will increase 30s chair stand reps from *** to *** with/without arms to demonstrate and improved functional ability with less pain/difficulty as well as reduce fall risk.   Baseline:  Goal status: INITIAL  2.  Patient will acknowledge ***/10 pain at least once during episode of care   Baseline:  Goal status: INITIAL  3.  Patient will score at least ***% on FOTO to signify clinically meaningful improvement in functional abilities.   Baseline:  Goal status: INITIAL  4.  *** Baseline:  Goal status: INITIAL  5.  *** Baseline:  Goal status: INITIAL  6.  *** Baseline:  Goal status: INITIAL   PLAN:  PT FREQUENCY: 1-2x/week  PT DURATION: 6 weeks  PLANNED INTERVENTIONS: 97110-Therapeutic exercises, 97530- Therapeutic activity, [id number]- Neuromuscular re-education, 97535- Self Care, 02859-  Manual therapy, and Patient/Family education  PLAN FOR NEXT SESSION: HEP review and update, manual techniques as appropriate, aerobic tasks, ROM and flexibility activities, strengthening and PREs, TPDN, gait and balance training,aquatic therapy, modalities for pain and NMRE      Diahn Waidelich M Ishaan Villamar, PT 08/18/2024, 11:06 AM      "

## 2024-08-19 ENCOUNTER — Ambulatory Visit

## 2024-09-01 ENCOUNTER — Encounter: Payer: Self-pay | Admitting: Family Medicine
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
## 2024-09-04 ENCOUNTER — Ambulatory Visit: Attending: Neurology

## 2024-09-04 ENCOUNTER — Other Ambulatory Visit: Payer: Self-pay

## 2024-09-04 DIAGNOSIS — M79605 Pain in left leg: Secondary | ICD-10-CM | POA: Insufficient documentation

## 2024-09-04 DIAGNOSIS — M5417 Radiculopathy, lumbosacral region: Secondary | ICD-10-CM | POA: Insufficient documentation

## 2024-09-04 DIAGNOSIS — M6281 Muscle weakness (generalized): Secondary | ICD-10-CM | POA: Diagnosis present

## 2024-09-04 DIAGNOSIS — M5459 Other low back pain: Secondary | ICD-10-CM | POA: Diagnosis present

## 2024-09-04 DIAGNOSIS — M79602 Pain in left arm: Secondary | ICD-10-CM | POA: Diagnosis present

## 2024-09-04 DIAGNOSIS — M542 Cervicalgia: Secondary | ICD-10-CM | POA: Insufficient documentation

## 2024-09-04 DIAGNOSIS — R2689 Other abnormalities of gait and mobility: Secondary | ICD-10-CM | POA: Insufficient documentation

## 2024-09-08 ENCOUNTER — Ambulatory Visit: Admitting: Family Medicine

## 2024-09-08 VITALS — Ht 67.0 in | Wt 210.0 lb

## 2024-09-08 DIAGNOSIS — M5412 Radiculopathy, cervical region: Secondary | ICD-10-CM | POA: Diagnosis present

## 2024-09-08 NOTE — Progress Notes (Signed)
 "  PCP: Emerick Avelina POUR, PA-C  Patient is a 44 y.o. male here for follow-up for left shoulder and neck pain.  HPI Patient was seen on 06/23/2024 regarding his left shoulder and neck pain which he reported was ongoing for some time.  He has noticed limitations in his range of motion of his left shoulder and difficulty completing his daily tasks.  He also noticed some numbness/tingling down his arm.  His symptoms kept him out of work at that time.  Patient was treated for possible cervical radiculopathy at that time with PT, MRI to evaluate further.  Patient declined prednisone  Dosepak at that time.  Continued to take Robaxin  as needed.  MRI results reveal cervical spondylosis with mild spinal stenosis at C4-5.  Multilevel foraminal narrowing, right worse than left C4 foraminal stenosis, moderate right with mild left C5 foraminal narrowing, and severe left C6 foraminal stenosis.  Patient received of fluoroscopic guided epidural steroid injection into the C6-C7 space on 11/20, but he feels like this made his pain worse. He is still doing PT which he started last week, he is doing his home exercises which has improved his motion.   Denies any numbness and tingling into his hands. He started making dietary modifications to help with the inflammation as well. He has been incorporating more pineapple water which has helped with his inflammation.  He is taking Robaxin  as needed  Past Medical History:  Diagnosis Date   Back pain    Headache    Hx of migraines     Medications Ordered Prior to Encounter[1]  No past surgical history on file.  Allergies[2]  Ht 5' 7 (1.702 m)   Wt 210 lb (95.3 kg)   BMI 32.89 kg/m       No data to display              No data to display              Objective:  Physical Exam:  Neck/LeftShoulder Exam Gen: NAD, comfortable in exam room Inspection: No visible lesions, edema, erythema, overlying skin changes  Palpation: No TTP over AC joint  bilaterally. ROM: Improved active range of motion from previous visit.  FROM, some mild pain with shoulder flexion Special Tests: Negative Neer's, Hawkins, full and empty can testing bilaterally.  Negative spurlings. Strength: 5/5 in bilateral upper extremities NVI distally  Assessment and Plan:   Assessment & Plan Cervical radiculopathy Pain has significantly improved with fluoroscopic guided epidural steroid injection, PT, and dietary modifications.  Discussed continuing PT and home exercises as able.  Advised use of Robaxin  as needed to manage pain.  Recommended getting NCS done on 1/22 as scheduled.  Continue care with neurology.  Follow-up as needed.  Kathrine Melena, DO [facility]      [1]  Current Outpatient Medications on File Prior to Visit  Medication Sig Dispense Refill   celecoxib  (CELEBREX ) 200 MG capsule Take 1 capsule (200 mg total) by mouth 2 (two) times daily. 60 capsule 2   fenofibrate (TRICOR) 145 MG tablet Take 145 mg by mouth daily. (Patient not taking: Reported on 08/04/2024)     gabapentin (NEURONTIN) 300 MG capsule Take 300 mg by mouth at bedtime.     ketorolac  (TORADOL ) 10 MG tablet Take 1 tablet (10 mg total) by mouth every 6 (six) hours as needed (pain). 20 tablet 0   methocarbamol  (ROBAXIN ) 500 MG tablet Take 1 tablet (500 mg total) by mouth every 8 (eight) hours  as needed. 60 tablet 1   No current facility-administered medications on file prior to visit.  [2]  Allergies Allergen Reactions   Bee Venom Anaphylaxis   Bee Venom Anaphylaxis   Bactrim  [Sulfamethoxazole -Trimethoprim ]     Localized drug reaction   "

## 2024-09-09 ENCOUNTER — Ambulatory Visit

## 2024-09-09 ENCOUNTER — Telehealth: Payer: Self-pay
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
## 2024-09-09 NOTE — Telephone Encounter (Signed)
 PT called and spoke to patient, he is sick and did not make appointment. Reviewed attendance policy and confirmed next appointment.   Alm Stephen Ward   09/09/24 10:01 AM

## 2024-09-11 ENCOUNTER — Ambulatory Visit: Payer: Self-pay | Admitting: Neurology

## 2024-09-11 DIAGNOSIS — M5417 Radiculopathy, lumbosacral region: Secondary | ICD-10-CM

## 2024-09-11 DIAGNOSIS — M5412 Radiculopathy, cervical region: Secondary | ICD-10-CM

## 2024-09-11 NOTE — Procedures (Signed)
 " [facility]  [address], [address]  [place], [place] [address] Tel: [phone] Fax: [phone] Test Date:  09/11/2024  Patient: Stephen Ward DOB: [birth year] Physician: Tonita Blanch, DO  Sex: Male Height: 5' 7 Ref Phys: Tonita Blanch, DO  ID#: [id number]   Technician:    History: This is a 44 year old man referred for evaluation of generalized paresthesias of the hands and feet.  NCV & EMG Findings: Extensive electrodiagnostic testing of the left upper and lower extremity shows:  Left median, ulnar, mixed palmar, sural, and superficial peroneal sensory responses are within normal limits. Left median, ulnar, peroneal, and tibial motor responses are within normal limits. Left tibial H reflex studies within normal limits. Chronic motor axon loss changes are seen affecting the left C6 myotome, without accompanying active denervation.    Impression: Chronic C6 radiculopathy affecting the left upper extremity, mild.   There is no evidence of a large fiber sensorimotor polyneuropathy, lumbosacral radiculopathy, or carpal tunnel syndrome.   ___________________________ Tonita Blanch, DO    Nerve Conduction Studies   Stim Site NR Peak (ms) Norm Peak (ms) O-P Amp (V) Norm O-P Amp  Left Median Anti Sensory (2nd Digit)  32 C  Wrist    3.1 <3.4 35.7 >20  Left Sup Peroneal Anti Sensory (Ant Lat Mall)  32 C  12 cm    2.9 <4.5 11.1 >5  Left Sural Anti Sensory (Lat Mall)  32 C  Calf    3.0 <4.5 11.3 >5  Left Ulnar Anti Sensory (5th Digit)  32 C  Wrist    2.6 <3.1 26.8 >12     Stim Site NR Onset (ms) Norm Onset (ms) O-P Amp (mV) Norm O-P Amp Site1 Site2 Delta-0 (ms) Dist (cm) Vel (m/s) Norm Vel (m/s)  Left Median Motor (Abd Poll Brev)  32 C  Wrist    3.0 <3.9 12.9 >6 Elbow Wrist 5.1 31.0 61 >50  Elbow    8.1  12.8         Left Peroneal Motor (Ext Dig Brev)  32 C  Ankle    3.0 <5.5 8.8 >3 B Fib Ankle 7.3 37.0 51 >40  B Fib    10.3  8.0  Poplt B Fib 1.4 8.0 57  >40  Poplt    11.7  8.0         Left Tibial Motor (Abd Hall Brev)  32 C  Ankle    3.8 <6.0 8.4 >8 Knee Ankle 7.1 40.0 56 >40  Knee    10.9  4.8         Left Ulnar Motor (Abd Dig Minimi)  32 C  Wrist    2.0 <3.1 11.9 >7 B Elbow Wrist 3.8 22.0 58 >50  B Elbow    5.8  11.4  A Elbow B Elbow 1.7 10.0 59 >50  A Elbow    7.5  11.4            Stim Site NR Peak (ms) Norm Peak (ms) P-T Amp (V) Site1 Site2 Delta-P (ms) Norm Delta (ms)  Left Median/Ulnar Palm Comparison (Wrist - 8cm)  32 C  Median Palm    1.7 <2.2 38.5 Median Palm Ulnar Palm 0.3   Ulnar Palm    1.4 <2.2 11.1       Electromyography   Side Muscle Ins.Act Fibs Fasc Recrt Amp Dur Poly Activation Comment  Left 1stDorInt Nml Nml Nml Nml Nml Nml Nml Nml N/A  Left PronatorTeres Nml Nml Nml *  1- *1+ *1+ *1+ Nml N/A  Left Biceps Nml Nml Nml *1- *1+ *1+ *1+ Nml N/A  Left Triceps Nml Nml Nml Nml Nml Nml Nml Nml N/A  Left Deltoid Nml Nml Nml Nml Nml Nml Nml Nml N/A  Left AntTibialis Nml Nml Nml Nml Nml Nml Nml Nml N/A  Left Gastroc Nml Nml Nml Nml Nml Nml Nml Nml N/A  Left Flex Dig Long Nml Nml Nml Nml Nml Nml Nml Nml N/A  Left RectFemoris Nml Nml Nml Nml Nml Nml Nml Nml N/A  Left GluteusMed Nml Nml Nml Nml Nml Nml Nml Nml N/A      Waveforms:                         "

## 2024-09-12 ENCOUNTER — Ambulatory Visit: Admitting: Physical Therapy

## 2024-09-12 ENCOUNTER — Ambulatory Visit: Payer: Self-pay | Admitting: Neurology

## 2024-09-15 ENCOUNTER — Ambulatory Visit

## 2024-09-18 ENCOUNTER — Ambulatory Visit: Admitting: Physical Therapy

## 2024-09-18 ENCOUNTER — Encounter: Payer: Self-pay | Admitting: Physical Therapy

## 2024-09-18 DIAGNOSIS — M6281 Muscle weakness (generalized): Secondary | ICD-10-CM

## 2024-09-18 DIAGNOSIS — M5459 Other low back pain: Secondary | ICD-10-CM

## 2024-09-18 DIAGNOSIS — M542 Cervicalgia: Secondary | ICD-10-CM | POA: Diagnosis not present

## 2024-09-18 DIAGNOSIS — M79602 Pain in left arm: Secondary | ICD-10-CM
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
## 2024-09-23 ENCOUNTER — Ambulatory Visit

## 2024-09-26 ENCOUNTER — Ambulatory Visit: Admitting: Physical Therapy

## 2024-09-26 ENCOUNTER — Telehealth: Payer: Self-pay | Admitting: Physical Therapy

## 2024-09-26 NOTE — Telephone Encounter (Signed)
 Spoke to patient regarding no show #2. He is out of town due to a family emergency and forgot to call and reschedule. Confirmed next appointment and reminded of attendance policy. He does not recall his first no show.

## 2024-09-29 ENCOUNTER — Ambulatory Visit

## 2024-10-02 ENCOUNTER — Ambulatory Visit
